# Patient Record
Sex: Male | Born: 1935 | Race: White | Hispanic: No | Marital: Married | Smoking: Former smoker
Health system: Southern US, Community
[De-identification: ages and names within clinical notes are randomized; demographics above are authoritative.]

## PROBLEM LIST (undated history)

## (undated) DIAGNOSIS — I10 Essential (primary) hypertension: Secondary | ICD-10-CM

## (undated) DIAGNOSIS — C801 Malignant (primary) neoplasm, unspecified: Secondary | ICD-10-CM

## (undated) HISTORY — PX: HIP FRACTURE SURGERY: SHX118

## (undated) HISTORY — PX: APPENDECTOMY: SHX54

## (undated) HISTORY — PX: FEMUR SURGERY: SHX943

## (undated) HISTORY — PX: FRACTURE SURGERY: SHX138

---

## 2006-04-01 ENCOUNTER — Ambulatory Visit: Payer: Self-pay | Admitting: Unknown Physician Specialty

## 2017-07-12 ENCOUNTER — Inpatient Hospital Stay
Admission: EM | Admit: 2017-07-12 | Discharge: 2017-07-15 | DRG: 078 | Disposition: A | Discharge status: Skilled Nursing Facility | Payer: Medicare Other | Attending: Internal Medicine | Admitting: Internal Medicine

## 2017-07-12 ENCOUNTER — Other Ambulatory Visit: Payer: Self-pay

## 2017-07-12 ENCOUNTER — Emergency Department: Payer: Medicare Other

## 2017-07-12 DIAGNOSIS — F0390 Unspecified dementia without behavioral disturbance: Secondary | ICD-10-CM | POA: Diagnosis present

## 2017-07-12 DIAGNOSIS — I161 Hypertensive emergency: Secondary | ICD-10-CM | POA: Diagnosis present

## 2017-07-12 DIAGNOSIS — Z8546 Personal history of malignant neoplasm of prostate: Secondary | ICD-10-CM | POA: Diagnosis not present

## 2017-07-12 DIAGNOSIS — Z96641 Presence of right artificial hip joint: Secondary | ICD-10-CM | POA: Diagnosis present

## 2017-07-12 DIAGNOSIS — Z9114 Patient's other noncompliance with medication regimen: Secondary | ICD-10-CM

## 2017-07-12 DIAGNOSIS — L89301 Pressure ulcer of unspecified buttock, stage 1: Secondary | ICD-10-CM | POA: Diagnosis present

## 2017-07-12 DIAGNOSIS — Z1389 Encounter for screening for other disorder: Secondary | ICD-10-CM

## 2017-07-12 DIAGNOSIS — Z8249 Family history of ischemic heart disease and other diseases of the circulatory system: Secondary | ICD-10-CM | POA: Diagnosis not present

## 2017-07-12 DIAGNOSIS — J209 Acute bronchitis, unspecified: Secondary | ICD-10-CM | POA: Diagnosis present

## 2017-07-12 DIAGNOSIS — T465X6A Underdosing of other antihypertensive drugs, initial encounter: Secondary | ICD-10-CM | POA: Diagnosis present

## 2017-07-12 DIAGNOSIS — I10 Essential (primary) hypertension: Secondary | ICD-10-CM | POA: Diagnosis present

## 2017-07-12 DIAGNOSIS — G9340 Encephalopathy, unspecified: Secondary | ICD-10-CM | POA: Diagnosis present

## 2017-07-12 DIAGNOSIS — Z7982 Long term (current) use of aspirin: Secondary | ICD-10-CM | POA: Diagnosis not present

## 2017-07-12 DIAGNOSIS — R4182 Altered mental status, unspecified: Secondary | ICD-10-CM

## 2017-07-12 DIAGNOSIS — E785 Hyperlipidemia, unspecified: Secondary | ICD-10-CM | POA: Diagnosis present

## 2017-07-12 DIAGNOSIS — E860 Dehydration: Secondary | ICD-10-CM

## 2017-07-12 DIAGNOSIS — Z87891 Personal history of nicotine dependence: Secondary | ICD-10-CM

## 2017-07-12 DIAGNOSIS — Z0189 Encounter for other specified special examinations: Secondary | ICD-10-CM

## 2017-07-12 DIAGNOSIS — L89891 Pressure ulcer of other site, stage 1: Secondary | ICD-10-CM | POA: Diagnosis present

## 2017-07-12 DIAGNOSIS — L8990 Pressure ulcer of unspecified site, unspecified stage: Secondary | ICD-10-CM

## 2017-07-12 DIAGNOSIS — F03A0 Unspecified dementia, mild, without behavioral disturbance, psychotic disturbance, mood disturbance, and anxiety: Secondary | ICD-10-CM

## 2017-07-12 DIAGNOSIS — I674 Hypertensive encephalopathy: Principal | ICD-10-CM | POA: Diagnosis present

## 2017-07-12 HISTORY — DX: Malignant (primary) neoplasm, unspecified: C80.1

## 2017-07-12 HISTORY — DX: Essential (primary) hypertension: I10

## 2017-07-12 LAB — CBC WITH DIFFERENTIAL/PLATELET
BASOS ABS: 0.1 10*3/uL (ref 0–0.1)
Basophils Relative: 1 %
EOS ABS: 0.3 10*3/uL (ref 0–0.7)
Eosinophils Relative: 4 %
HCT: 41.6 % (ref 40.0–52.0)
HEMOGLOBIN: 13.9 g/dL (ref 13.0–18.0)
LYMPHS PCT: 10 %
Lymphs Abs: 0.7 10*3/uL — ABNORMAL LOW (ref 1.0–3.6)
MCH: 30.8 pg (ref 26.0–34.0)
MCHC: 33.4 g/dL (ref 32.0–36.0)
MCV: 92 fL (ref 80.0–100.0)
Monocytes Absolute: 0.7 10*3/uL (ref 0.2–1.0)
Monocytes Relative: 10 %
NEUTROS PCT: 75 %
Neutro Abs: 4.9 10*3/uL (ref 1.4–6.5)
PLATELETS: 255 10*3/uL (ref 150–440)
RBC: 4.52 MIL/uL (ref 4.40–5.90)
RDW: 15 % — ABNORMAL HIGH (ref 11.5–14.5)
WBC: 6.5 10*3/uL (ref 3.8–10.6)

## 2017-07-12 LAB — COMPREHENSIVE METABOLIC PANEL
ALBUMIN: 4.1 g/dL (ref 3.5–5.0)
ALT: 15 U/L — AB (ref 17–63)
AST: 37 U/L (ref 15–41)
Alkaline Phosphatase: 57 U/L (ref 38–126)
Anion gap: 10 (ref 5–15)
BUN: 33 mg/dL — AB (ref 6–20)
CHLORIDE: 108 mmol/L (ref 101–111)
CO2: 21 mmol/L — AB (ref 22–32)
Calcium: 9.1 mg/dL (ref 8.9–10.3)
Creatinine, Ser: 1.03 mg/dL (ref 0.61–1.24)
GFR calc Af Amer: >60 mL/min (ref >60)
GFR calc non Af Amer: >60 mL/min (ref >60)
GLUCOSE: 107 mg/dL — AB (ref 65–99)
POTASSIUM: 3.8 mmol/L (ref 3.5–5.1)
Sodium: 139 mmol/L (ref 135–145)
Total Bilirubin: 0.7 mg/dL (ref 0.3–1.2)
Total Protein: 7.1 g/dL (ref 6.5–8.1)

## 2017-07-12 LAB — TROPONIN I: Troponin I: <0.03 ng/mL (ref <0.03)

## 2017-07-12 LAB — URINALYSIS, COMPLETE (UACMP) WITH MICROSCOPIC
BACTERIA UA: NONE SEEN
BILIRUBIN URINE: NEGATIVE
Glucose, UA: NEGATIVE mg/dL
Ketones, ur: 20 mg/dL — AB
LEUKOCYTES UA: NEGATIVE
Nitrite: NEGATIVE
PROTEIN: NEGATIVE mg/dL
SPECIFIC GRAVITY, URINE: 1.024 (ref 1.005–1.030)
pH: 5 (ref 5.0–8.0)

## 2017-07-12 LAB — ETHANOL

## 2017-07-12 MED ORDER — SODIUM CHLORIDE 0.9 % IV BOLUS
500.0000 mL | Freq: Once | INTRAVENOUS | Status: AC
  Administered 2017-07-12: 500 mL via INTRAVENOUS

## 2017-07-12 MED ORDER — LABETALOL HCL 5 MG/ML IV SOLN
20.0000 mg | Freq: Once | INTRAVENOUS | Status: AC
  Administered 2017-07-12: 20 mg via INTRAVENOUS
  Filled 2017-07-12: qty 4

## 2017-07-12 NOTE — H&P (Signed)
[facility] [place] NAME: Matthew Andersen    MR#:  [id number]  DATE OF BIRTH:  08-May-1931  DATE OF ADMISSION:  07/12/2017  PRIMARY CARE PHYSICIAN: Katheren Shams   REQUESTING/REFERRING PHYSICIAN:   CHIEF COMPLAINT:   Chief Complaint  Patient presents with  . Altered Mental Status    HISTORY OF PRESENT ILLNESS: Matthew Andersen  is a 84 y.o. male with a known history of hypertension. Patient is currently confused and cannot provide reliable history.  Most of the information was taken from reviewing the medical records and from discussion with emergency room physician. Apparently, he lives alone at home and he is usually independent and mentally very sharp.  He is still driving and was visiting his wife at the nursing home, earlier today, when he was noted to be confused.  He did not know the code to the nursing home door, which he normally does know.  He does not remember today's date and his date of birth, which again is very unusual per his son.  Patient himself, does not think there is anything wrong with him.  No fever or chills, no chest pain or shortness of breath, no bleeding noted.  She denies any fall.  He denies taking any new medications.  He denies drinking alcohol or taking illicit drugs. At the arrival to emergency room, his blood pressure was elevated at 197/104.  Patient does admit he has not been taking his medications "for a while". Blood test done emergency room, including CBC and CMP, are grossly unremarkable.  UA is negative for UTI.  Alcohol level is normal and urine drug screen test is negative. Chest x-ray and brain CT scan, read by myself are negative for acute abnormalities. Patient is admitted for further evaluation and treatment.  PAST MEDICAL HISTORY:   Past Medical History:  Diagnosis Date  . Cancer [facility])    Prostate  . Hypertension     PAST SURGICAL HISTORY:  Past Surgical History:  Procedure  Laterality Date  . APPENDECTOMY    . FEMUR SURGERY    . FRACTURE SURGERY    . HIP FRACTURE SURGERY      SOCIAL HISTORY:  Social History   Tobacco Use  . Smoking status: Former Research scientist (life sciences)  . Smokeless tobacco: Never Used  Substance Use Topics  . Alcohol use: Not Currently    FAMILY HISTORY: Hypertension in mother.   DRUG ALLERGIES:  Allergies  Allergen Reactions  . Cefzil [Cefprozil]   . Diphenhydramine   . Pseudoephedrine     REVIEW OF SYSTEMS:   Unable to obtain due to patient being confused.   MEDICATIONS AT HOME:  Prior to Admission medications   Medication Sig Start Date End Date Taking? Authorizing Provider  amLODipine-benazepril (LOTREL) 5-20 MG capsule Take 1 capsule by mouth daily.   Yes [provider]  aspirin EC 81 MG tablet Take 81 mg by mouth daily.   Yes [provider]  budesonide (PULMICORT) 180 MCG/ACT inhaler Inhale 1 puff into the lungs 2 (two) times daily.   Yes [provider]  pravastatin (PRAVACHOL) 10 MG tablet TAKE 1 TABLET BY MOUTH EVERYDAY AT BEDTIME 05/18/17  Yes [provider]      PHYSICAL EXAMINATION:   VITAL SIGNS: Blood pressure (!) 153/67, pulse 74, temperature 98.1 F (36.7 C), temperature source Oral, resp. rate 18, height 6' (1.829 m), weight 81.6 kg (180 lb), SpO2 96 %.  GENERAL:  84 y.o.-year-old patient lying  in the bed with no acute distress.  Pleasantly confused. EYES: Pupils equal, round, reactive to light and accommodation. No scleral icterus. Extraocular muscles intact.  HEENT: Head atraumatic, normocephalic. Oropharynx and nasopharynx clear.  NECK:  Supple, no jugular venous distention. No thyroid enlargement, no tenderness.  LUNGS: Normal breath sounds bilaterally, no wheezing, rales,rhonchi or crepitation. No use of accessory muscles of respiration.  CARDIOVASCULAR: S1, S2 normal. No S3/S4.  ABDOMEN: Soft, nontender, nondistended. Bowel sounds present. No organomegaly or mass.   EXTREMITIES: No pedal edema, cyanosis, or clubbing.  NEUROLOGIC: Cranial nerves II through XII are intact. Muscle strength 5/5 in all extremities. Sensation intact. Gait is stable.  PSYCHIATRIC: The patient is alert, but confused.  SKIN: No obvious rash or lesions.   LABORATORY PANEL:   CBC Recent Labs  Lab 07/12/17 2131  WBC 6.5  HGB 13.9  HCT 41.6  PLT 255  MCV 92.0  MCH 30.8  MCHC 33.4  RDW 15.0*  LYMPHSABS 0.7*  MONOABS 0.7  EOSABS 0.3  BASOSABS 0.1   ------------------------------------------------------------------------------------------------------------------  Chemistries  Recent Labs  Lab 07/12/17 2131  NA 139  K 3.8  CL 108  CO2 21*  GLUCOSE 107*  BUN 33*  CREATININE 1.03  CALCIUM 9.1  AST 37  ALT 15*  ALKPHOS 57  BILITOT 0.7   ------------------------------------------------------------------------------------------------------------------ estimated creatinine clearance is 59.6 mL/min (by C-G formula based on SCr of 1.03 mg/dL). ------------------------------------------------------------------------------------------------------------------ No results for input(s): TSH, T4TOTAL, T3FREE, THYROIDAB in the last 72 hours.  Invalid input(s): FREET3   Coagulation profile No results for input(s): INR, PROTIME in the last 168 hours. ------------------------------------------------------------------------------------------------------------------- No results for input(s): DDIMER in the last 72 hours. -------------------------------------------------------------------------------------------------------------------  Cardiac Enzymes Recent Labs  Lab 07/12/17 2131  TROPONINI <0.03   ------------------------------------------------------------------------------------------------------------------ Invalid input(s): POCBNP  ---------------------------------------------------------------------------------------------------------------  Urinalysis     Component Value Date/Time   COLORURINE YELLOW (A) 07/12/2017 2131   APPEARANCEUR CLEAR (A) 07/12/2017 2131   LABSPEC 1.024 07/12/2017 2131   PHURINE 5.0 07/12/2017 2131   GLUCOSEU NEGATIVE 07/12/2017 2131   HGBUR SMALL (A) 07/12/2017 2131   BILIRUBINUR NEGATIVE 07/12/2017 2131   KETONESUR 20 (A) 07/12/2017 2131   PROTEINUR NEGATIVE 07/12/2017 2131   NITRITE NEGATIVE 07/12/2017 2131   LEUKOCYTESUR NEGATIVE 07/12/2017 2131     RADIOLOGY: Dg Chest 2 View  Result Date: 07/12/2017 CLINICAL DATA:  Cough for a week.  Possible altered mental status. EXAM: CHEST - 2 VIEW COMPARISON:  None. FINDINGS: Mild hyperinflation. Normal heart size and pulmonary vascularity. No focal airspace disease or consolidation in the lungs. No blunting of costophrenic angles. No pneumothorax. Mediastinal contours appear intact. Calcification of the aorta. Degenerative changes in the spine and shoulders. IMPRESSION: Mild hyperinflation. No evidence of active pulmonary disease. Aortic atherosclerosis. Electronically Signed   By: Lucienne Capers M.D.   On: 07/12/2017 21:39   Ct Head Wo Contrast  Result Date: 07/12/2017 CLINICAL DATA:  Per EMS, pt was visiting his wife at [facility] where staff noticed pt was acting confused. EMS states pt's wife has been a resident there for 4 years however pt states she has been there 4 days, staff reports he was not able to locate his car. EMS states pt had difficulty answering year and DOB. Pt is able to answer DOB here at this time. EMS reports stroke screen negative. EXAM: CT HEAD WITHOUT CONTRAST TECHNIQUE: Contiguous axial images were obtained from the base of the skull through the vertex without intravenous contrast. COMPARISON:  None. FINDINGS: Brain: No  evidence of acute infarction, hemorrhage, hydrocephalus, extra-axial collection or mass lesion/mass effect. There is ventricular and sulcal enlargement reflecting age-appropriate volume loss. Mild patchy areas of white matter  hypoattenuation noted consistent with chronic microvascular ischemic change. Vascular: No hyperdense vessel or unexpected calcification. Skull: Normal. Negative for fracture or focal lesion. Sinuses/Orbits: Globes and orbits are unremarkable. Mild ethmoid sinus mucosal thickening. Remaining visualized sinuses and mastoid air cells are clear. Other: None. IMPRESSION: 1. No acute intracranial abnormalities. 2. Age related volume loss. Mild chronic microvascular ischemic change. Electronically Signed   By: Lajean Manes M.D.   On: 07/12/2017 21:28    EKG: Orders placed or performed during the hospital encounter of 07/12/17  . ED EKG  . ED EKG  . EKG 12-Lead  . EKG 12-Lead    IMPRESSION AND PLAN:  1.  Acute encephalopathy, likely hypertensive encephalopathy, due to noncompliance of blood pressure medications. 2.  Hypertensive emergency.  Plan:  Will avoid sudden, massive reduction in the blood pressure.  For now we will restart his home blood pressure medications and continue to monitor BP closely.  Continue frequent neuro checks.  Will rule out TIA/stroke.  Will check 2D echo, carotid ultrasound and brain MRI.  Neurology is consulted for further evaluation and treatment.  All the records are reviewed and case discussed with ED provider. Management plans discussed with the patient, family and they are in agreement.  CODE STATUS: FULL Advance Directive Documentation     Most Recent Value  Type of Advance Directive  Healthcare Power of Attorney, Living will  Pre-existing out of facility DNR order (yellow form or pink MOST form)  -  "MOST" Form in Place?  -       TOTAL TIME TAKING CARE OF THIS PATIENT: 45 minutes.    Amelia Jo M.D on 07/12/2017 at 11:41 PM  Between 7am to 6pm - Pager - [phone]  After 6pm go to [email or web address] - password [email or web address] [facility]  [facility]  Office  [phone]  CC: Primary care physician; Katheren Shams

## 2017-07-12 NOTE — ED Notes (Signed)
Pt states he is unsure if he has been taking his BP medications as ordered.

## 2017-07-12 NOTE — ED Provider Notes (Signed)
[facility] Emergency Department Provider Note  ____________________________________________   I have reviewed the triage vital signs and the nursing notes. Where available I have reviewed prior notes and, if possible and indicated, outside hospital notes.    HISTORY  Chief Complaint Altered Mental Status    HPI Matthew Andersen is a 84 y.o. male lives at home since his wife was placed in a nursing facility several years ago.  He is a retired Licensed conveyancer.  According to his son Shanon Brow, he is never unsure of the date and mentally very sharp.  He called his son a few times this morning and could not remember why he called.  This is atypical for him.  He went to the nursing home today it was noted that he did not know the code to the nursing home although he normally knows it, and was unsure of how long his wife had been there.  He also seem to be having trouble getting into his car, he went to the trunk instead of the car itself.  Etc.  Patient himself denies any confusion he states he feels his normal self.  He has no complaint.  He does have a chronic mild cough he states.  Patient has not been to this facility before.  He denies any fevers chills chest pain shortness of breath headache focal numbness or weakness, or any other complaints.  Patient states that his wife is been at the facility for 4 days and not for years which has been verified ability falls, according to EMS, and patient was irascible with them when he did not know the answers to some of the questions.  He did not know his birthday for that which is atypical apparently. Is no way to tell when he began to become confused.  Patient states he has not been taking his blood pressure medication for the last month.   No past medical history on file.  There are no active problems to display for this patient.     Prior to Admission medications   Not on File    Allergies Patient has no allergy  information on record.  No family history on file.  Social History Social History   Tobacco Use  . Smoking status: Not on file  Substance Use Topics  . Alcohol use: Not on file  . Drug use: Not on file    Review of Systems Constitutional: No fever/chills Eyes: No visual changes. ENT: No sore throat. No stiff neck no neck pain Cardiovascular: Denies chest pain. Respiratory: Denies shortness of breath. Gastrointestinal:   no vomiting.  No diarrhea.  No constipation. Genitourinary: Negative for dysuria. Musculoskeletal: Negative lower extremity swelling Skin: Negative for rash. Neurological: Negative for severe headaches, focal weakness or numbness.   ____________________________________________   PHYSICAL EXAM:  VITAL SIGNS: ED Triage Vitals  Enc Vitals Group     BP --      Pulse --      Resp --      Temp --      Temp src --      SpO2 --      Weight 07/12/17 2101 180 lb (81.6 kg)     Height 07/12/17 2101 6' (1.829 m)     Head Circumference --      Peak Flow --      Pain Score 07/12/17 2058 0     Pain Loc --      Pain Edu? --  Excl. in [place]? --     Constitutional: Alert and name unsure of the day and thinks it might be 2010 initially was unsure of his birthday although now can tell me, patient seems to have some lack of knowledge about things that apparently he should know. Well appearing and in no acute distress. Eyes: Conjunctivae are normal Head: Atraumatic HEENT: No congestion/rhinnorhea. Mucous membranes are moist.  Oropharynx non-erythematous Neck:   Nontender with no meningismus, no masses, no stridor Cardiovascular: Normal rate, regular rhythm. Grossly normal heart sounds.  Good peripheral circulation. Respiratory: Normal respiratory effort.  No retractions. Lungs CTAB. Abdominal: Soft and nontender. No distention. No guarding no rebound Back:  There is no focal tenderness or step off.  there is no midline tenderness there are no lesions noted. there  is no CVA tenderness Musculoskeletal: No lower extremity tenderness, no upper extremity tenderness. No joint effusions, no DVT signs strong distal pulses no edema Neurologic:  Normal speech and language. No gross focal neurologic deficits are appreciated.  Skin:  Skin is warm, dry and intact. No rash noted. Psychiatric: Mood and affect are normal. Speech and behavior are normal.  ____________________________________________   LABS (all labs ordered are listed, but only abnormal results are displayed)  Labs Reviewed  URINALYSIS, COMPLETE (UACMP) WITH MICROSCOPIC  CBC WITH DIFFERENTIAL/PLATELET  ETHANOL  COMPREHENSIVE METABOLIC PANEL    Pertinent labs  results that were available during my care of the patient were reviewed by me and considered in my medical decision making (see chart for details). ____________________________________________  EKG  I personally interpreted any EKGs ordered by me or triage No old for comparison, sinus rhythm rate 94, LAFB noted, RBBB noted.  No acute ischemic changes ____________________________________________  RADIOLOGY  Pertinent labs & imaging results that were available during my care of the patient were reviewed by me and considered in my medical decision making (see chart for details). If possible, patient and/or family made aware of any abnormal findings.  No results found. ____________________________________________    PROCEDURES  Procedure(s) performed: None  Procedures  Critical Care performed: None  ____________________________________________   INITIAL IMPRESSION / ASSESSMENT AND PLAN / ED COURSE  Pertinent labs & imaging results that were available during my care of the patient were reviewed by me and considered in my medical decision making (see chart for details).  Patient here with acute atypical confusion, apparently acute according to family, we will admit him to the hospital for further work-up.  His blood pressure  is elevated, hypertension certainly could cause this or an occult ischemic event, CT is reassuring.  I did give him medication his blood pressure is trending down.  We will admit for further observation.    ____________________________________________   FINAL CLINICAL IMPRESSION(S) / ED DIAGNOSES  Final diagnoses:  None      This chart was dictated using voice recognition software.  Despite best efforts to proofread,  errors can occur which can change meaning.      Schuyler Amor, MD 07/12/17 [phone]

## 2017-07-12 NOTE — ED Notes (Signed)
This RN spoke with pt's son Shanon Brow per pt's consent. Shanon Brow states his father has not had hx of confusion or dementia/alzheimer's. Shanon Brow states his father called him 3 times this morning around 9am, when Shanon Brow returned the calls around 3pm his father was not sure if he had called then and stated "I guess I did, I don't know." Shanon Brow states WOM contacted him about pt being here.  David's # for updates: [phone]

## 2017-07-12 NOTE — ED Notes (Signed)
Patient transported to CT 

## 2017-07-12 NOTE — ED Triage Notes (Signed)
Per EMS, pt was visiting his wife at [facility] where staff noticed pt was acting confused. EMS states pt's wife has been a resident there for 4 years however pt states she has been there 4 days, staff reports he was not able to locate his car. EMS states pt had difficulty answering year and DOB. Pt is able to answer DOB here at this time. EMS reports stroke screen negative. EMS VS: 101 blood sugar, 197/104, 110-115 HR, 97% on RA.

## 2017-07-13 ENCOUNTER — Inpatient Hospital Stay: Payer: Medicare Other

## 2017-07-13 ENCOUNTER — Encounter: Payer: Self-pay | Admitting: Radiology

## 2017-07-13 ENCOUNTER — Inpatient Hospital Stay
Admit: 2017-07-13 | Discharge: 2017-07-13 | Disposition: A | Discharge status: Home / Self Care | Payer: Medicare Other | Attending: Internal Medicine | Admitting: Internal Medicine

## 2017-07-13 DIAGNOSIS — L8990 Pressure ulcer of unspecified site, unspecified stage: Secondary | ICD-10-CM

## 2017-07-13 LAB — URINE DRUG SCREEN, QUALITATIVE (ARMC ONLY)
AMPHETAMINES, UR SCREEN: NOT DETECTED
BENZODIAZEPINE, UR SCRN: NOT DETECTED
Cannabinoid 50 Ng, Ur ~~LOC~~: NOT DETECTED
Cocaine Metabolite,Ur ~~LOC~~: NOT DETECTED
MDMA (Ecstasy)Ur Screen: NOT DETECTED
METHADONE SCREEN, URINE: NOT DETECTED
Opiate, Ur Screen: NOT DETECTED
PHENCYCLIDINE (PCP) UR S: NOT DETECTED
Tricyclic, Ur Screen: NOT DETECTED

## 2017-07-13 LAB — HEMOGLOBIN A1C
Hgb A1c MFr Bld: 5 % (ref 4.8–5.6)
Mean Plasma Glucose: 96.8 mg/dL

## 2017-07-13 LAB — ECHOCARDIOGRAM COMPLETE
HEIGHTINCHES: 72 in
Weight: 2880 oz

## 2017-07-13 LAB — LIPID PANEL
Cholesterol: 187 mg/dL (ref 0–200)
HDL: 82 mg/dL (ref >40)
LDL CALC: 98 mg/dL (ref 0–99)
Total CHOL/HDL Ratio: 2.3 RATIO
Triglycerides: 33 mg/dL (ref <150)
VLDL: 7 mg/dL (ref 0–40)

## 2017-07-13 LAB — TSH: TSH: 1.415 u[IU]/mL (ref 0.350–4.500)

## 2017-07-13 MED ORDER — SODIUM CHLORIDE 0.9% FLUSH
3.0000 mL | INTRAVENOUS | Status: DC | PRN
  Administered 2017-07-13: 13:00:00 3 mL via INTRAVENOUS
  Filled 2017-07-13: qty 3

## 2017-07-13 MED ORDER — ACETAMINOPHEN 160 MG/5ML PO SOLN
650.0000 mg | ORAL | Status: DC | PRN
  Filled 2017-07-13: qty 20.3

## 2017-07-13 MED ORDER — AMLODIPINE BESY-BENAZEPRIL HCL 5-20 MG PO CAPS: 1.0000 | ORAL_CAPSULE | Freq: Every day | ORAL | Status: DC

## 2017-07-13 MED ORDER — STROKE: EARLY STAGES OF RECOVERY BOOK
Freq: Once | Status: AC
  Administered 2017-07-13: 02:00:00

## 2017-07-13 MED ORDER — BENAZEPRIL HCL 20 MG PO TABS
20.0000 mg | ORAL_TABLET | Freq: Every day | ORAL | Status: DC
  Administered 2017-07-13 – 2017-07-15 (×3): 20 mg via ORAL
  Filled 2017-07-13 (×4): qty 1

## 2017-07-13 MED ORDER — GUAIFENESIN-DM 100-10 MG/5ML PO SYRP
5.0000 mL | ORAL_SOLUTION | ORAL | Status: DC | PRN
  Administered 2017-07-13: 5 mL via ORAL
  Filled 2017-07-13 (×2): qty 5

## 2017-07-13 MED ORDER — BUDESONIDE 0.25 MG/2ML IN SUSP
0.2500 mg | Freq: Two times a day (BID) | RESPIRATORY_TRACT | Status: DC
  Administered 2017-07-13 – 2017-07-15 (×6): 0.25 mg via RESPIRATORY_TRACT
  Filled 2017-07-13 (×6): qty 2

## 2017-07-13 MED ORDER — ACETAMINOPHEN 325 MG PO TABS: 650.0000 mg | ORAL_TABLET | ORAL | Status: DC | PRN

## 2017-07-13 MED ORDER — SODIUM CHLORIDE 0.9 % IV SOLN
Freq: Once | INTRAVENOUS | Status: AC
  Administered 2017-07-13: 02:00:00 via INTRAVENOUS

## 2017-07-13 MED ORDER — HYDRALAZINE HCL 20 MG/ML IJ SOLN: 5.0000 mg | INTRAMUSCULAR | Status: DC | PRN

## 2017-07-13 MED ORDER — IOHEXOL 350 MG/ML SOLN
75.0000 mL | Freq: Once | INTRAVENOUS | Status: AC | PRN
  Administered 2017-07-13: 75 mL via INTRAVENOUS

## 2017-07-13 MED ORDER — ASPIRIN EC 81 MG PO TBEC
81.0000 mg | DELAYED_RELEASE_TABLET | Freq: Every day | ORAL | Status: DC
  Administered 2017-07-13 – 2017-07-15 (×3): 81 mg via ORAL
  Filled 2017-07-13 (×3): qty 1

## 2017-07-13 MED ORDER — SENNOSIDES-DOCUSATE SODIUM 8.6-50 MG PO TABS: 1.0000 | ORAL_TABLET | Freq: Every evening | ORAL | Status: DC | PRN

## 2017-07-13 MED ORDER — PRAVASTATIN SODIUM 20 MG PO TABS
20.0000 mg | ORAL_TABLET | Freq: Every day | ORAL | Status: DC
  Administered 2017-07-13 – 2017-07-14 (×2): 20 mg via ORAL
  Filled 2017-07-13 (×2): qty 1

## 2017-07-13 MED ORDER — AMLODIPINE BESYLATE 5 MG PO TABS
5.0000 mg | ORAL_TABLET | Freq: Every day | ORAL | Status: DC
  Administered 2017-07-13 – 2017-07-15 (×3): 5 mg via ORAL
  Filled 2017-07-13 (×3): qty 1

## 2017-07-13 MED ORDER — ACETAMINOPHEN 650 MG RE SUPP: 650.0000 mg | RECTAL | Status: DC | PRN

## 2017-07-13 MED ORDER — QUETIAPINE FUMARATE 25 MG PO TABS
25.0000 mg | ORAL_TABLET | Freq: Once | ORAL | Status: AC
  Administered 2017-07-13: 25 mg via ORAL
  Filled 2017-07-13: qty 1

## 2017-07-13 MED ORDER — HEPARIN SODIUM (PORCINE) 5000 UNIT/ML IJ SOLN
5000.0000 [IU] | Freq: Three times a day (TID) | INTRAMUSCULAR | Status: DC
  Administered 2017-07-13 – 2017-07-15 (×7): 5000 [IU] via SUBCUTANEOUS
  Filled 2017-07-13 (×7): qty 1

## 2017-07-13 MED ORDER — SODIUM CHLORIDE 0.9% FLUSH
3.0000 mL | Freq: Two times a day (BID) | INTRAVENOUS | Status: DC
  Administered 2017-07-13 – 2017-07-15 (×5): 3 mL via INTRAVENOUS

## 2017-07-13 MED ORDER — BUDESONIDE 180 MCG/ACT IN AEPB: 1.0000 | INHALATION_SPRAY | Freq: Two times a day (BID) | RESPIRATORY_TRACT | Status: DC

## 2017-07-13 NOTE — ED Notes (Signed)
Transport to floor room 118.AS

## 2017-07-13 NOTE — Progress Notes (Signed)
OT Cancellation Note  Patient Details Name: AUTREY HUMAN MRN: [id number] DOB: [birth year]   Cancelled Treatment:    Reason Eval/Treat Not Completed: Patient at procedure or test/ unavailable. Order received, chart reviewed. Pt out of room for testing. Will re-attempt OT evaluation at later date/time as pt is available and medically appropriate.  Jeni Salles, MPH, MS, OTR/L ascom [phone] 07/13/17, 9:05 AM

## 2017-07-13 NOTE — Evaluation (Signed)
Physical Therapy Evaluation Patient Details Name: Matthew Andersen MRN: [id number] DOB: 10-27-1931 Today's Date: 07/13/2017   History of Present Illness  presented to ER secondary to AMS; admitted for management of acute encephalopathy, likely hypertensive per chart.  CTH negative for acute intracranial process; MRI pending.  Clinical Impression  Upon evaluation, patient alert and oriented to self, location; follows simple commands, but demonstrates noted deficits in STM, safety awareness/insight and problem-solving. Unable to demonstrate ability to indep manage household responsibilities at this time (due to acute cognitive deficits).  Bilat UE/LE strength and ROM grossly symmetrical and WFL; no focal weakness, coordination or sensory deficit appreciated.  Able to complete bed mobility with mod indep; sit/stand, basic transfers and gait (25') without assist device, min assist.  Very short, shuffling and slightly staggered steps, requiring external support from environment and therapist to stabilize.  Additional trial (160') completed with RW, performance improving to cga/close sup with noted improvement in comfort, confidence.  Do recommend continued use of RW with all mobility at this time; patient voices agreement/understanding. Would benefit from skilled PT to address above deficits and promote optimal return to PLOF; recommend transition to STR upon discharge from acute hospitalization.  Will continue to monitor and update recommendations as medical status, cognitive status improves throughout hospitalization.     Follow Up Recommendations SNF    Equipment Recommendations  Rolling walker with 5" wheels    Recommendations for Other Services       Precautions / Restrictions Precautions Precautions: Fall Restrictions Weight Bearing Restrictions: No      Mobility  Bed Mobility Overal bed mobility: Modified Independent                Transfers Overall transfer level: Needs  assistance   Transfers: Sit to/from Stand Sit to Stand: Min guard;Min assist            Ambulation/Gait Ambulation/Gait assistance: Min assist Gait Distance (Feet): 20 Feet Assistive device: None       General Gait Details: very short, shuffling, staggering steps; decreased weight acceptance L LE (due to L knee pain).  Guarded trunk position with limited arm swing; intermittently reaching for walls/furniture for external stabilization  Stairs            Wheelchair Mobility    Modified Rankin (Stroke Patients Only)       Balance Overall balance assessment: Needs assistance Sitting-balance support: No upper extremity supported;Feet supported Sitting balance-Leahy Scale: Good     Standing balance support: No upper extremity supported Standing balance-Leahy Scale: Fair                               Pertinent Vitals/Pain Pain Assessment: No/denies pain    Home Living Family/patient expects to be discharged to:: Private residence Living Arrangements: Alone   Type of Home: House Home Access: Stairs to enter Entrance Stairs-Rails: Psychiatric nurse of Steps: 5 Home Layout: One level Home Equipment: Environmental consultant - 2 wheels;Cane - single point      Prior Function Level of Independence: Independent         Comments: Indep with ADLs, household and community mobilization without assist device; + driving; visits wife daily in STR/LTC     Hand Dominance        Extremity/Trunk Assessment   Upper Extremity Assessment Upper Extremity Assessment: Overall WFL for tasks assessed    Lower Extremity Assessment Lower Extremity Assessment: Overall WFL for tasks assessed(grossly at least  4/5 throughout; moderate valgus to L knee)       Communication   Communication: No difficulties  Cognition Arousal/Alertness: Awake/alert Behavior During Therapy: WFL for tasks assessed/performed Overall Cognitive Status: Impaired/Different from  baseline                                 General Comments: oriented to self, location; appropriately refers to watch for date/time (compensatory strategy).  Follows simple commands; noted difficulty with STM, processing, safety awareness and insight.  Unable to demonstrate ability to safely manage household responsibilities.      General Comments      Exercises Other Exercises Other Exercises: 28' with RW, cga/close sup-improved step height/length and overall gait symmetry; improved comfort and confidence with use of RW.  L knee with persistent valgus, maintains flexed position throughout gait cycle (reports this is baseline for him).  Do recommend continued use of RW with all mobility at this time; patient voices agreement/understanding.   Assessment/Plan    PT Assessment Patient needs continued PT services  PT Problem List Decreased strength;Decreased activity tolerance;Decreased balance;Decreased mobility;Decreased coordination;Decreased cognition;Decreased knowledge of use of DME;Decreased safety awareness;Decreased knowledge of precautions       PT Treatment Interventions DME instruction;Gait training;Stair training;Functional mobility training;Therapeutic activities;Therapeutic exercise;Balance training;Cognitive remediation;Patient/family education    PT Goals (Current goals can be found in the Care Plan section)  Acute Rehab PT Goals Patient Stated Goal: to move around a little PT Goal Formulation: With patient Time For Goal Achievement: 07/27/17 Potential to Achieve Goals: Good    Frequency Min 2X/week   Barriers to discharge Decreased caregiver support      Co-evaluation               AM-PAC PT "6 Clicks" Daily Activity  Outcome Measure Difficulty turning over in bed (including adjusting bedclothes, sheets and blankets)?: None Difficulty moving from lying on back to sitting on the side of the bed? : None Difficulty sitting down on and standing  up from a chair with arms (e.g., wheelchair, bedside commode, etc,.)?: None Help needed moving to and from a bed to chair (including a wheelchair)?: A Little Help needed walking in hospital room?: A Little Help needed climbing 3-5 steps with a railing? : A Little 6 Click Score: 21    End of Session Equipment Utilized During Treatment: Gait belt Activity Tolerance: Patient tolerated treatment well Patient left: in chair;with call bell/phone within reach;with chair alarm set Nurse Communication: Mobility status PT Visit Diagnosis: Unsteadiness on feet (R26.81);Difficulty in walking, not elsewhere classified (R26.2)    Time: 3532-9924 PT Time Calculation (min) (ACUTE ONLY): 33 min   Charges:   PT Evaluation $PT Eval Moderate Complexity: 1 Mod PT Treatments $Gait Training: 8-22 mins   PT G Codes:        Cleston Lautner H. Owens Shark, PT, DPT, NCS 07/13/17, 9:56 AM [phone]

## 2017-07-13 NOTE — Progress Notes (Signed)
Patient ID: Matthew Andersen, male   DOB: [birth year], 84 y.o.   MRN: [id number]  [employer] PROGRESS NOTE  VEGA WITHROW ZJQ:[id number] DOB: Feb 05, 1931 DOA: 07/12/2017 PCP: Katheren Shams  HPI/Subjective: Patient feels okay.  His major complaint that is gone to go home to feed the cat.  He states that he went to visit his wife at [facility] and could not find his car and clicked his clicker to open up the trunk and then he saw the car.  People from [facility] believes he was not acting right and they called the police and they brought him into the hospital with EMS.  His blood pressure was found to be very high and he was admitted to the hospital.  Objective: Vitals:   07/13/17 0730 07/13/17 1124  BP: (!) 170/82 (!) 161/81  Pulse: 83 78  Resp:  20  Temp:  97.6 F (36.4 C)  SpO2: 96% 97%    Filed Weights   07/12/17 2101  Weight: 81.6 kg (180 lb)    ROS: Review of Systems  Constitutional: Negative for chills and fever.  Eyes: Negative for blurred vision.  Respiratory: Negative for cough and shortness of breath.   Cardiovascular: Negative for chest pain.  Gastrointestinal: Negative for abdominal pain, constipation, diarrhea, nausea and vomiting.  Genitourinary: Negative for dysuria.  Musculoskeletal: Negative for joint pain.  Neurological: Negative for dizziness and headaches.   Exam: Physical Exam  HENT:  Nose: No mucosal edema.  Mouth/Throat: No oropharyngeal exudate or posterior oropharyngeal edema.  Eyes: Pupils are equal, round, and reactive to light. Conjunctivae, EOM and lids are normal.  Neck: No JVD present. Carotid bruit is not present. No edema present. No thyroid mass and no thyromegaly present.  Cardiovascular: S1 normal and S2 normal. Exam reveals no gallop.  No murmur heard. Pulses:      Dorsalis pedis pulses are 2+ on the right side, and 2+ on the left side.  Respiratory: No respiratory distress. He has no wheezes. He has no  rhonchi. He has no rales.  GI: Soft. Bowel sounds are normal. There is no tenderness.  Musculoskeletal:       Right ankle: He exhibits no swelling.       Left ankle: He exhibits no swelling.  Lymphadenopathy:    He has no cervical adenopathy.  Neurological: He is alert. No cranial nerve deficit.  Power 5 out of 5 bilateral upper and lower extremities.  Skin: Skin is warm. No rash noted. Nails show no clubbing.  Psychiatric: He has a normal mood and affect.      Data Reviewed: Basic Metabolic Panel: Recent Labs  Lab 07/12/17 2131  NA 139  K 3.8  CL 108  CO2 21*  GLUCOSE 107*  BUN 33*  CREATININE 1.03  CALCIUM 9.1   Liver Function Tests: Recent Labs  Lab 07/12/17 2131  AST 37  ALT 15*  ALKPHOS 57  BILITOT 0.7  PROT 7.1  ALBUMIN 4.1   CBC: Recent Labs  Lab 07/12/17 2131  WBC 6.5  NEUTROABS 4.9  HGB 13.9  HCT 41.6  MCV 92.0  PLT 255   Cardiac Enzymes: Recent Labs  Lab 07/12/17 2131  TROPONINI <0.03     Studies: Dg Chest 2 View  Result Date: 07/12/2017 CLINICAL DATA:  Cough for a week.  Possible altered mental status. EXAM: CHEST - 2 VIEW COMPARISON:  None. FINDINGS: Mild hyperinflation. Normal heart size and pulmonary vascularity. No focal airspace disease or consolidation  in the lungs. No blunting of costophrenic angles. No pneumothorax. Mediastinal contours appear intact. Calcification of the aorta. Degenerative changes in the spine and shoulders. IMPRESSION: Mild hyperinflation. No evidence of active pulmonary disease. Aortic atherosclerosis. Electronically Signed   By: Lucienne Capers M.D.   On: 07/12/2017 21:39   Dg Pelvis 1-2 Views  Result Date: 07/13/2017 CLINICAL DATA:  MRI clearance. EXAM: PELVIS - 1-2 VIEW COMPARISON:  None. FINDINGS: Exam demonstrates a right total hip arthroplasty intact and normally located. There are degenerative changes of the spine and left hip. IMPRESSION: No acute findings. Right total hip arthroplasty intact.  Electronically Signed   By: Marin Olp M.D.   On: 07/13/2017 09:49   Dg Abd 1 View  Result Date: 07/13/2017 CLINICAL DATA:  MRI clearance. EXAM: ABDOMEN - 1 VIEW COMPARISON:  None. FINDINGS: Examination demonstrates a nonobstructive bowel gas pattern with mild fecal retention throughout the colon. Moderate degenerate change of the spine. Mild degenerate change of the left hip. Right total hip arthroplasty is present. IMPRESSION: Nonobstructive bowel gas pattern with mild fecal retention throughout the colon. Partially visualized right hip arthroplasty intact. Electronically Signed   By: Marin Olp M.D.   On: 07/13/2017 09:49   Ct Head Wo Contrast  Result Date: 07/12/2017 CLINICAL DATA:  Per EMS, pt was visiting his wife at [facility] where staff noticed pt was acting confused. EMS states pt's wife has been a resident there for 4 years however pt states she has been there 4 days, staff reports he was not able to locate his car. EMS states pt had difficulty answering year and DOB. Pt is able to answer DOB here at this time. EMS reports stroke screen negative. EXAM: CT HEAD WITHOUT CONTRAST TECHNIQUE: Contiguous axial images were obtained from the base of the skull through the vertex without intravenous contrast. COMPARISON:  None. FINDINGS: Brain: No evidence of acute infarction, hemorrhage, hydrocephalus, extra-axial collection or mass lesion/mass effect. There is ventricular and sulcal enlargement reflecting age-appropriate volume loss. Mild patchy areas of white matter hypoattenuation noted consistent with chronic microvascular ischemic change. Vascular: No hyperdense vessel or unexpected calcification. Skull: Normal. Negative for fracture or focal lesion. Sinuses/Orbits: Globes and orbits are unremarkable. Mild ethmoid sinus mucosal thickening. Remaining visualized sinuses and mastoid air cells are clear. Other: None. IMPRESSION: 1. No acute intracranial abnormalities. 2. Age related volume  loss. Mild chronic microvascular ischemic change. Electronically Signed   By: Lajean Manes M.D.   On: 07/12/2017 21:28   Mr Brain Wo Contrast  Result Date: 07/13/2017 CLINICAL DATA:  Altered mental status. EXAM: MRI HEAD WITHOUT CONTRAST TECHNIQUE: Multiplanar, multiecho pulse sequences of the brain and surrounding structures were obtained without intravenous contrast. COMPARISON:  Head CT 07/12/2017 FINDINGS: Brain: There is no evidence of acute infarct, intracranial hemorrhage, mass, midline shift, or extra-axial fluid collection. Generalized cerebral atrophy is mild-to-moderate for age. Scattered cerebral white matter T2 hyperintensities are nonspecific but compatible with chronic small vessel ischemic disease, minimal for age. A chronic lacunar infarct is noted in the left thalamus. Vascular: Major intracranial vascular flow voids are preserved. Skull and upper cervical spine: No suspicious marrow lesion. Sinuses/Orbits: Bilateral cataract extraction. Mild bilateral ethmoid air cell mucosal thickening. Small right maxillary sinus mucous retention cyst. Clear mastoid air cells. Other: None. IMPRESSION: 1. No acute intracranial abnormality. 2. Mild-to-moderate cerebral atrophy and minimal chronic small vessel ischemic disease. Electronically Signed   By: Logan Bores M.D.   On: 07/13/2017 11:29   US Carotid Bilateral (  at [place] Only)  Result Date: 07/13/2017 CLINICAL DATA:  Acute encephalopathy. History of hypertension and smoking. EXAM: BILATERAL CAROTID DUPLEX ULTRASOUND TECHNIQUE: Pearline Cables scale imaging, color Doppler and duplex ultrasound were performed of bilateral carotid and vertebral arteries in the neck. COMPARISON:  None. FINDINGS: Criteria: Quantification of carotid stenosis is based on velocity parameters that correlate the residual internal carotid diameter with NASCET-based stenosis levels, using the diameter of the distal internal carotid lumen as the denominator for stenosis measurement.  The following velocity measurements were obtained: RIGHT ICA:  148/21 cm/sec CCA:  19/14 cm/sec SYSTOLIC ICA/CCA RATIO:  1.7 ECA:  153 cm/sec LEFT ICA:  96/17 cm/sec CCA:  782/95 cm/sec SYSTOLIC ICA/CCA RATIO:  0.9 ECA:  94 cm/sec RIGHT CAROTID ARTERY: There is a minimal amount of eccentric mixed echogenic plaque scattered throughout the right common carotid artery (images 3 and 7). There is a moderate amount of eccentric mixed echogenic plaque within the right carotid bulb (image 15). There is a large amount of eccentric echogenic densely shadowing plaque involving the origin and proximal aspects of the right internal carotid artery (image 23), resulting in elevated peak systolic velocities within the proximal aspect the right internal carotid artery. Greatest acquired peak systolic velocity with the proximal right ICA measures 140 cm/sec - image 24. RIGHT VERTEBRAL ARTERY:  Antegrade Flow LEFT CAROTID ARTERY: There is a minimal amount of eccentric mixed echogenic plaque involving the origin and proximal aspects of the left internal carotid artery (image 55), not resulting in elevated peak systolic velocities within the interrogated course the left internal carotid artery to suggest a hemodynamically significant stenosis. LEFT VERTEBRAL ARTERY: Antegrade flow though monophasic waveform is demonstrated. IMPRESSION: 1. Moderate to large amount of right-sided atherosclerotic plaque results in elevated peak systolic velocities within the right internal carotid artery compatible with the 50 to 69% luminal narrowing range. Further evaluation with CTA could be performed as clinically indicated. 2. Minimal amount of left-sided atherosclerotic plaque, not resulting in a hemodynamically significant stenosis. Electronically Signed   By: Sandi Mariscal M.D.   On: 07/13/2017 08:36    Scheduled Meds: . amLODipine  5 mg Oral Daily   And  . benazepril  20 mg Oral Daily  . aspirin EC  81 mg Oral Daily  . budesonide (PULMICORT)  nebulizer solution  0.25 mg Nebulization BID  . heparin  5,000 Units Subcutaneous Q8H  . pravastatin  20 mg Oral q1800  . sodium chloride flush  3 mL Intravenous Q12H   Continuous Infusions:  Assessment/Plan:  1. Hypertensive encephalopathy.  Patient noncompliant with his blood pressure medications.  Blood pressure trending better on Norvasc and benazepril.  Mental status is still a little concerning.  Send off TSH, RPR and vitamin B12.  MRI of the brain negative for stroke. 2. Carotid stenosis on sonogram.  Get a CT Angie of the neck for further evaluation. 3. Hyperlipidemia unspecified on pravastatin 4. History of prostate cancer. 5. Unsteady gait.  Physical therapy recommended rehab.  Spoke with son.  He has been having difficulty walking for long time. 6. Stage I decubitus ulcer on buttock, stage I ulceration left second toe  Code Status:     Code Status Orders  (From admission, onward)        Start     Ordered   07/13/17 0055  Full code  Continuous     07/13/17 0055    Code Status History    This patient has a current code status but no  historical code status.    Advance Directive Documentation     Most Recent Value  Type of Advance Directive  Healthcare Power of Attorney  Pre-existing out of facility DNR order (yellow form or pink MOST form)  -  "MOST" Form in Place?  -     Family Communication:  spoke with son on the phone Disposition Plan: To be determined  Time spent: 28 minutes  [place]

## 2017-07-13 NOTE — Progress Notes (Signed)
Pt has demonstrated difficulty with memory recall and decision making such as trying to contact family, friend or neighbor to car for cat that is alone in the home since yesterday. NIH-2. Pt states phones don't work in that he can't call the phone numbers; difficulty with recall. Becomes irritated when ask about calling some one for cat, " oh, just never mind; my son will take care of it" Pt informed that son lives 2 hours away and had not planned to come care for cat. Discussed with son concern of cat and states he is not coming and he will call his dad- advised son that pt is not thinking clearly at this time and may not be able to process and carry out task. States he will call his dad and talk to him.Social work is aware. CT MRA confirms 50-60% plaque in right neck.

## 2017-07-13 NOTE — Plan of Care (Signed)
Patient appears forgetful and confused at times.

## 2017-07-13 NOTE — Progress Notes (Signed)
PT Cancellation Note  Patient Details Name: JUVENAL UMAR MRN: [id number] DOB: 06-05-1931   Cancelled Treatment:    Reason Eval/Treat Not Completed: Patient at procedure or test/unavailable(Consult received and chart reviewed.  Patient currently off unit for diagnostic testing.  Will re-attempt at later time/date as medically appropriate and available.)   Thayer Inabinet H. Owens Shark, PT, DPT, NCS 07/13/17, 8:38 AM [phone]

## 2017-07-13 NOTE — Evaluation (Signed)
Speech Language Pathology Evaluation Patient Details Name: VERNER MCCRONE MRN: [id number] DOB: [birth year] Today's Date: 07/13/2017 Time: 9563-8756 SLP Time Calculation (min) (ACUTE ONLY): 60 min  Problem List:  Patient Active Problem List   Diagnosis Date Noted  . Acute encephalopathy 07/12/2017   Past Medical History:  Past Medical History:  Diagnosis Date  . [facility])    Prostate  . Hypertension    Past Surgical History:  Past Surgical History:  Procedure Laterality Date  . APPENDECTOMY    . FEMUR SURGERY    . FRACTURE SURGERY    . HIP FRACTURE SURGERY     HPI:  presented to ER secondary to AMS; admitted for management of acute encephalopathy, likely hypertensive per chart.  CTH negative for acute intracranial process.  MRI shows: 1. No acute intracranial abnormality.  2. Mild-to-moderate cerebral atrophy and minimal chronic small  vessel ischemic disease.  Prior level of function was independent at home, providing companionship to wife in nursing home.   Assessment / Plan / Recommendation Clinical Impression  84 year old man with acute onset of confusion is presenting with moderate cognitive communication deficits per screening with the [facility] Cognitive Assessment (scored 15/30 with cut-off point ? 26/30).  The patient is demonstrating difficulty with visuospatial/executive skills, immediate and short term memory, word fluency, abstract language, and orientation to place and time. It is not clear if the current cognitive deficits are of recent onset or chronic/ongoing deficits.  He may have been experiencing more cognitive decline than has been acknowledged.  He would benefit from ongoing cognitive assessment and treatment in his next setting.    SLP Assessment  SLP Recommendation/Assessment: Patient needs continued Speech Lanaguage Pathology Services SLP Visit Diagnosis: Cognitive communication deficit (R41.841)    Follow Up Recommendations  (SLP f/u in next  setting)    Frequency and Duration min 1 x/week         SLP Evaluation Cognition  Overall Cognitive Status: Impaired/Different from baseline(MOCA 8.1 administered ) Arousal/Alertness: Awake/alert Orientation Level: Oriented to person;Disoriented to time;Disoriented to place     Montreal Cognitive Assessment (MOCA) Version: 8.1 Visuospatieal/Executive Alternating trail making       1/1 Visuoconstruction Skills (copy 3-d design) 0/1 Draw a clock     2/3 Naming     3/3 Attention Forward digit span    1/1 Backward digit span    1/1 Vigilance     1/1 Serial 7's     2/3 Language  Verbal Fluency     0/1 Repetition     2/2 Abstraction     0/2 Delayed Recall    0/5  Memory Index Score   2/15 Orientation     2/6 TOTAL      15/30       Normal  ? 26/30     Comprehension       Expression     Oral / Motor  Oral Motor/Sensory Function Overall Oral Motor/Sensory Function: Within functional limits Motor Speech Overall Motor Speech: Appears within functional limits for tasks assessed   GO                   Leroy Sea, MS/CCC- SLP  Valetta Fuller, Susie 07/13/2017, 1:05 PM

## 2017-07-13 NOTE — ED Notes (Signed)
Dr. Maier in rm 

## 2017-07-13 NOTE — Plan of Care (Signed)
Patient is a new admit to the unit for stroke work up. Routine admission care provided. NIH completed, neuro check being done every 2 hours. Safety measures in place. Vital signs closely being monitored. Orientation to the unit provided. Needs attended.

## 2017-07-13 NOTE — Progress Notes (Signed)
OT Cancellation Note  Patient Details Name: Matthew Andersen MRN: [id number] DOB: October 17, 1931   Cancelled Treatment:    Reason Eval/Treat Not Completed: Other (comment). On 2nd attempt, SLP in to evaluate for cognition. Will re-attempt OT evaluation at later date/time as pt is available.  Jeni Salles, MPH, MS, OTR/L ascom [phone] 07/13/17, 12:17 PM

## 2017-07-13 NOTE — Clinical Social Work Note (Addendum)
CSW received consult for possible placement. PT is pending. The patient seems not to have insurance on file which would be a barrier to placement. CSW will assess pending PT evaluation.  UPDATE: PT is recommending SNF, and the patient's insurance has been updated. CSW will assess when able.  Santiago Bumpers, MSW, Latanya Presser [phone]

## 2017-07-14 DIAGNOSIS — F0390 Unspecified dementia without behavioral disturbance: Secondary | ICD-10-CM

## 2017-07-14 DIAGNOSIS — R4182 Altered mental status, unspecified: Secondary | ICD-10-CM

## 2017-07-14 DIAGNOSIS — F03A0 Unspecified dementia, mild, without behavioral disturbance, psychotic disturbance, mood disturbance, and anxiety: Secondary | ICD-10-CM

## 2017-07-14 LAB — VITAMIN B12: Vitamin B-12: 455 pg/mL (ref 180–914)

## 2017-07-14 NOTE — NC FL2 (Signed)
[place] LEVEL OF CARE SCREENING TOOL     IDENTIFICATION  Patient Name: Matthew Andersen Birthdate: 10/22/1931 Sex: male Admission Date (Current Location): 07/12/2017  [place] and [place] Number:  Engineering geologist and Address:  [facility], [address], [place], [place] [address]      Provider Number: [id number]  Attending Physician Name and Address:  Loletha Grayer, MD  Relative Name and Phone Number:  Dodd Schmid ([phone]    Current Level of Care: Hospital Recommended Level of Care: [place] Prior Approval Number:    Date Approved/Denied:   PASRR Number: [id number]  Discharge Plan: SNF    Current Diagnoses: Patient Active Problem List   Diagnosis Date Noted  . Mild dementia 07/14/2017  . Pressure injury of skin 07/13/2017  . Acute encephalopathy 07/12/2017    Orientation RESPIRATION BLADDER Height & Weight     Self, Time, Situation, Place  Normal Continent Weight: 180 lb (81.6 kg) Height:  6' (182.9 cm)  BEHAVIORAL SYMPTOMS/MOOD NEUROLOGICAL BOWEL NUTRITION STATUS      Continent Diet(Heart healthy)  AMBULATORY STATUS COMMUNICATION OF NEEDS Skin   Extensive Assist Verbally Normal                       Personal Care Assistance Level of Assistance  Bathing, Feeding, Dressing Bathing Assistance: Limited assistance Feeding assistance: Independent Dressing Assistance: Limited assistance     Functional Limitations Info  Sight, Hearing, Speech Sight Info: Adequate Hearing Info: Adequate Speech Info: Adequate    SPECIAL CARE FACTORS FREQUENCY  PT (By licensed PT), OT (By licensed OT)     PT Frequency: 5X per week OT Frequency: 3X per week            Contractures Contractures Info: Not present    Additional Factors Info  Code Status, Allergies Code Status Info: Full Allergies Info: Cefzil Cefprozil, Diphenhydramine, Pseudoephedrine           Current Medications  (07/14/2017):  This is the current hospital active medication list Current Facility-Administered Medications  Medication Dose Route Frequency Provider Last Rate Last Dose  . acetaminophen (TYLENOL) tablet 650 mg  650 mg Oral Q4H PRN Amelia Jo, MD       Or  . acetaminophen (TYLENOL) solution 650 mg  650 mg Per Tube Q4H PRN Amelia Jo, MD       Or  . acetaminophen (TYLENOL) suppository 650 mg  650 mg Rectal Q4H PRN Amelia Jo, MD      . amLODipine (NORVASC) tablet 5 mg  5 mg Oral Daily Amelia Jo, MD   5 mg at 07/14/17 0736   And  . benazepril (LOTENSIN) tablet 20 mg  20 mg Oral Daily Amelia Jo, MD   20 mg at 07/14/17 0737  . aspirin EC tablet 81 mg  81 mg Oral Daily Amelia Jo, MD   81 mg at 07/14/17 0738  . budesonide (PULMICORT) nebulizer solution 0.25 mg  0.25 mg Nebulization BID Amelia Jo, MD   0.25 mg at 07/14/17 0718  . guaiFENesin-dextromethorphan (ROBITUSSIN DM) 100-10 MG/5ML syrup 5 mL  5 mL Oral Q4H PRN Amelia Jo, MD   5 mL at 07/13/17 0235  . heparin injection 5,000 Units  5,000 Units Subcutaneous Q8H Amelia Jo, MD   5,000 Units at 07/14/17 1456  . hydrALAZINE (APRESOLINE) injection 5 mg  5 mg Intravenous Q4H PRN Arta Silence, MD      . pravastatin (PRAVACHOL) tablet 20 mg  20 mg Oral q1800 Amelia Jo, MD   20 mg at 07/13/17 1836  . senna-docusate (Senokot-S) tablet 1 tablet  1 tablet Oral QHS PRN Amelia Jo, MD      . sodium chloride flush (NS) 0.9 % injection 3 mL  3 mL Intravenous Q12H Loletha Grayer, MD   3 mL at 07/14/17 0741  . sodium chloride flush (NS) 0.9 % injection 3 mL  3 mL Intravenous PRN Loletha Grayer, MD   3 mL at 07/13/17 1245     Discharge Medications: Please see discharge summary for a list of discharge medications.  Relevant Imaging Results:  Relevant Lab Results:   Additional Information SS# [id number]  Zettie Pho, LCSW

## 2017-07-14 NOTE — Evaluation (Signed)
Occupational Therapy Evaluation Patient Details Name: Matthew Andersen MRN: [id number] DOB: 1931-03-02 Today's Date: 07/14/2017    History of Present Illness presented to ER secondary to AMS; admitted for management of acute encephalopathy, likely hypertensive per chart.  CTH negative for acute intracranial process; MRI pending.   Clinical Impression   Met with pt up in chair eating lunch, agreeable to OT this date. Pt feeding self independently. No functional t/fs completed to allow pt to finish engaging in meal. Pt able to write on piece of paper, no Willis noted in dominant hand. ROM/MMT in [place] for tasks observed. Per chart review, pt with minimal functional mobility deficits, most concerns lie in cognition/safety awareness. Pt telling OT several previous stories about him and his wife with timelines not lining up, asking for specific PT from another venue, saying wife cannot go home but wants wife to go home. When asked orientation questions pt oriented to self and situation. Pt reports year is 2009 and it's June 20-21st. When asked location pt stated [facility] hospital in [place]. When asked how to reach emergency help pt replies: "I'd call I nurse I know, or 911 but they could not answer". Pt with no insight or awareness to safety deficits, which raises concern for IADL completion in the home and community. Pt seems to have been devising compensatory strategies to mask cognitive deficits (reports using car beeper or trunk opener to find vehicle in crowded parking lot, etc.) Significant safety concerns for pt to return home alone. Will benefit from OT services to continue discovering which ADL/IADL most affected by safety.    Follow Up Recommendations  SNF;Supervision - Intermittent (possible placement to allow for safety supervision of pt)   Equipment Recommendations       Recommendations for Other Services       Precautions / Restrictions Precautions Precautions:  Fall Restrictions Weight Bearing Restrictions: No      Mobility Bed Mobility Overal bed mobility: Modified Independent                Transfers                 General transfer comment: pt up in chair eating lunch this date, did not further transfers to allow pt to finish meal    Balance Overall balance assessment: No apparent balance deficits (not formally assessed)                                         ADL either performed or assessed with clinical judgement   ADL Overall ADL's : Needs assistance/impaired Eating/Feeding: Independent   Grooming: Set up   Upper Body Bathing: Set up   Lower Body Bathing: Set up   Upper Body Dressing : Set up   Lower Body Dressing: Set up   Toilet Transfer: Supervision/safety   Toileting- Clothing Manipulation and Hygiene: Supervision/safety   Tub/ Shower Transfer: Supervision/safety   Functional mobility during ADLs: Supervision/safety;Cueing for safety General ADL Comments: Pt needing supervision/safety due to significant periods of confusion and lack of safety awareness to perform ADL/IADL.     Vision Baseline Vision/History: Wears glasses Wears Glasses: At all times Patient Visual Report: No change from baseline       Perception     Praxis      Pertinent Vitals/Pain Pain Assessment: No/denies pain     Hand Dominance     Extremity/Trunk  Assessment Upper Extremity Assessment Upper Extremity Assessment: Overall WFL for tasks assessed(grossly 4/5)   Lower Extremity Assessment Lower Extremity Assessment: Overall WFL for tasks assessed       Communication Communication Communication: No difficulties   Cognition Arousal/Alertness: Awake/alert Behavior During Therapy: WFL for tasks assessed/performed Overall Cognitive Status: Impaired/Different from baseline Area of Impairment: Memory;Safety/judgement;Orientation                 Orientation Level: Person;Situation    Memory: Decreased short-term memory   Safety/Judgement: Decreased awareness of safety;Decreased awareness of deficits     General Comments: Pt able to recall self and DOB and knows he is in hospital. Pt able to recall president. Unable to specify hospital, unable to specify year or date   General Comments       Exercises     Shoulder Instructions      Home Living Family/patient expects to be discharged to:: Private residence Living Arrangements: Alone   Type of Home: House Home Access: Stairs to enter [employer] of Steps: 5 Entrance Stairs-Rails: [place]: One [place]: Environmental consultant - 2 wheels;Cane - single point          Prior Functioning/Environment Level of Independence: Independent        Comments: independent, but questionable how well pt has been functioning with safety- little insight of safety awareness- seems to be compensating. Was visiting wife daily (driving) in SNF/LTC        OT Problem List: Decreased cognition;Decreased safety awareness      OT Treatment/Interventions: Self-care/ADL training;Cognitive remediation/compensation    OT Goals(Current goals can be found in the care plan section) Acute Rehab OT Goals Patient Stated Goal: to go home OT Goal Formulation: With patient Time For Goal Achievement: 08/11/17 Potential to Achieve Goals: Good  OT Frequency: Min 2X/week   Barriers to D/C: Decreased caregiver support  pt currently lives alone and not many family members near by for support, concerns for in home safety while alone       Co-evaluation              AM-PAC PT "6 Clicks" Daily Activity     Outcome Measure Help from another person eating meals?: None Help from another person taking care of personal grooming?: None Help from another person toileting, which includes using toliet, bedpan, or urinal?: A Little Help from another person bathing (including washing, rinsing,  drying)?: A Little Help from another person to put on and taking off regular upper body clothing?: A Little Help from another person to put on and taking off regular lower body clothing?: A Little 6 Click Score: 20   End of Session    Activity Tolerance: Patient tolerated treatment well Patient left: in chair;with call bell/phone within reach;with chair alarm set  OT Visit Diagnosis: Other symptoms and signs involving cognitive function                Time: 1200-1220 OT Time Calculation (min): 20 min Charges:  OT General Charges $OT Visit: 1 Visit OT Evaluation $OT Eval Low Complexity: 1 Low G-Codes:     Zenovia Jarred, MSOT, OTR/L  [place] 07/14/2017, 12:52 PM

## 2017-07-14 NOTE — Consult Note (Signed)
[facility] Face-to-Face Psychiatry Consult   Reason for Consult: Consult for 84 year old man brought to the hospital with acute confusion and hypertension.  Concern about capacity. Referring Physician: Bobbye Charleston Patient Identification: Matthew Andersen MRN:  [id number] Principal Diagnosis: Mild dementia Diagnosis:   Patient Active Problem List   Diagnosis Date Noted  . Mild dementia [F03.90] 07/14/2017  . Pressure injury of skin [L89.90] 07/13/2017  . Acute encephalopathy [G93.40] 07/12/2017    Total Time spent with patient: 1 hour  Subjective:   Matthew Andersen is a 84 y.o. male patient admitted with "somebody thought that I was dangerous".  HPI: Patient seen chart reviewed.  84 year old man brought to the hospital day before yesterday by EMS.  Apparently he had gone to visit his wife at her assisted living facility and staff had seen him in the parking lot and thought that he looked confused.  911 was called and the patient was found to have extremely high blood pressure and was agreeable to coming to the hospital.  Since then concern has been raised about his cognitive condition.  Patient was a bit defensive but ultimately cooperative enough with my interview I think.  He is able to tell me the circumstances of how he came to the hospital although his interpretation of them is pretty facetious.  He is currently oriented to where he is and to the month although he misspoke the year.  Patient says his mood is chronically a little down about his wife but not hopelessly sad.  Denies ever having any suicidal thoughts.  Denies ever having any hallucinations.  Claims that he has not had any concerns about taking care of himself at home.  Patient says that at home he occasionally uses a walker but does not take one with him when he goes out driving.  Since being here is been documented by some observers that he was quite confused and disoriented at times.  Physical therapy saw him yesterday morning and judge  that he would be appropriate for skilled nursing facility.  Social history: Patient lives by himself.  His wife is in a assisted living facility because of severe neurologic injury.  Patient has been by himself at home for about 3 years.  Only son lives in [place].  Patient is a retired Chief Financial Officer.  Says that he gets assistance in his living situation from friends and neighbors.  Medical history: High blood pressure.  Patient does not know the names of his medicines.  He admits that he has not been getting them filled regularly and not been taking care of his blood pressure.  He is not sure what other medical problems he has.  Substance abuse history: Denies alcohol or drug abuse  Past Psychiatric History: Patient has no known past psychiatric history.  No history of psychiatric hospitalization never been on psychiatric medicine no history of suicide attempts  Risk to Self: Is patient at risk for suicide?: No Risk to Others:   Prior Inpatient Therapy:   Prior Outpatient Therapy:    Past Medical History:  Past Medical History:  Diagnosis Date  . Cancer [facility])    Prostate  . Hypertension     Past Surgical History:  Procedure Laterality Date  . APPENDECTOMY    . FEMUR SURGERY    . FRACTURE SURGERY    . HIP FRACTURE SURGERY     Family History: No family history on file. Family Psychiatric  History: His father committed suicide but he knows little else about the history  Social History:  Social History   Substance and Sexual Activity  Alcohol Use Not Currently     Social History   Substance and Sexual Activity  Drug Use Not on file    Social History   Socioeconomic History  . Marital status: Married    Spouse name: Not on file  . Number of children: Not on file  . Years of education: Not on file  . Highest education level: Not on file  Occupational History  . Not on file  Social Needs  . Financial resource strain: Not on file  . Food insecurity:    Worry: Not on file     Inability: Not on file  . Transportation needs:    Medical: Not on file    Non-medical: Not on file  Tobacco Use  . Smoking status: Former Research scientist (life sciences)  . Smokeless tobacco: Never Used  Substance and Sexual Activity  . Alcohol use: Not Currently  . Drug use: Not on file  . Sexual activity: Not on file  Lifestyle  . Physical activity:    Days per week: Not on file    Minutes per session: Not on file  . Stress: Not on file  Relationships  . Social connections:    Talks on phone: Not on file    Gets together: Not on file    Attends religious service: Not on file    Active member of club or organization: Not on file    Attends meetings of clubs or organizations: Not on file    Relationship status: Not on file  Other Topics Concern  . Not on file  Social History Narrative  . Not on file   Additional Social History:    Allergies:   Allergies  Allergen Reactions  . Cefzil [Cefprozil]   . Diphenhydramine   . Pseudoephedrine     Labs:  Results for orders placed or performed during the hospital encounter of 07/12/17 (from the past 48 hour(s))  Urinalysis, Complete w Microscopic     Status: Abnormal   Collection Time: 07/12/17  9:31 PM  Result Value Ref Range   Color, Urine YELLOW (A) YELLOW   APPearance CLEAR (A) CLEAR   Specific Gravity, Urine 1.024 1.005 - 1.030   pH 5.0 5.0 - 8.0   Glucose, UA NEGATIVE NEGATIVE mg/dL   Hgb urine dipstick SMALL (A) NEGATIVE   Bilirubin Urine NEGATIVE NEGATIVE   Ketones, ur 20 (A) NEGATIVE mg/dL   Protein, ur NEGATIVE NEGATIVE mg/dL   Nitrite NEGATIVE NEGATIVE   Leukocytes, UA NEGATIVE NEGATIVE   RBC / HPF 0-5 0 - 5 RBC/hpf   WBC, UA 0-5 0 - 5 WBC/hpf   Bacteria, UA NONE SEEN NONE SEEN   Squamous Epithelial / LPF 0-5 0 - 5   Mucus PRESENT    Hyaline Casts, UA PRESENT     Comment: Performed at [facility], [address] [place], [place] [address]  CBC with Differential     Status: Abnormal   Collection Time: 07/12/17   9:31 PM  Result Value Ref Range   WBC 6.5 3.8 - 10.6 K/uL   RBC 4.52 4.40 - 5.90 MIL/uL   Hemoglobin 13.9 13.0 - 18.0 g/dL   HCT 41.6 40.0 - 52.0 %   MCV 92.0 80.0 - 100.0 fL   MCH 30.8 26.0 - 34.0 pg   MCHC 33.4 32.0 - 36.0 g/dL   RDW 15.0 (H) 11.5 - 14.5 %   Platelets 255 150 - 440 K/uL  Neutrophils Relative % 75 %   Neutro Abs 4.9 1.4 - 6.5 K/uL   Lymphocytes Relative 10 %   Lymphs Abs 0.7 (L) 1.0 - 3.6 K/uL   Monocytes Relative 10 %   Monocytes Absolute 0.7 0.2 - 1.0 K/uL   Eosinophils Relative 4 %   Eosinophils Absolute 0.3 0 - 0.7 K/uL   Basophils Relative 1 %   Basophils Absolute 0.1 0 - 0.1 K/uL    Comment: Performed at [facility], [address], [place], [place] [address]  Ethanol     Status: None   Collection Time: 07/12/17  9:31 PM  Result Value Ref Range   Alcohol, Ethyl (B) <10 <10 mg/dL    Comment: (NOTE) Lowest detectable limit for serum alcohol is 10 mg/dL. For medical purposes only. Performed at [facility], [address] [place], [place] [address]   Comprehensive metabolic panel     Status: Abnormal   Collection Time: 07/12/17  9:31 PM  Result Value Ref Range   Sodium 139 135 - 145 mmol/L   Potassium 3.8 3.5 - 5.1 mmol/L   Chloride 108 101 - 111 mmol/L   CO2 21 (L) 22 - 32 mmol/L   Glucose, Bld 107 (H) 65 - 99 mg/dL   BUN 33 (H) 6 - 20 mg/dL   Creatinine, Ser 1.03 0.61 - 1.24 mg/dL   Calcium 9.1 8.9 - 10.3 mg/dL   Total Protein 7.1 6.5 - 8.1 g/dL   Albumin 4.1 3.5 - 5.0 g/dL   AST 37 15 - 41 U/L   ALT 15 (L) 17 - 63 U/L   Alkaline Phosphatase 57 38 - 126 U/L   Total Bilirubin 0.7 0.3 - 1.2 mg/dL   GFR calc non Af Amer >60 >60 mL/min   GFR calc Af Amer >60 >60 mL/min    Comment: (NOTE) The eGFR has been calculated using the CKD EPI equation. This calculation has not been validated in all clinical situations. eGFR's persistently <60 mL/min signify possible Chronic Kidney Disease.    Anion gap 10 5 - 15    Comment:  Performed at [facility], [address] [place], [place] [address]  Troponin I     Status: None   Collection Time: 07/12/17  9:31 PM  Result Value Ref Range   Troponin I <0.03 <0.03 ng/mL    Comment: Performed at [facility], [address] [place],  [address]  Urine Drug Screen, Qualitative     Status: Abnormal   Collection Time: 07/12/17  9:31 PM  Result Value Ref Range   Tricyclic, Ur Screen NONE DETECTED NONE DETECTED   Amphetamines, Ur Screen NONE DETECTED NONE DETECTED   MDMA (Ecstasy)Ur Screen NONE DETECTED NONE DETECTED   Cocaine Metabolite,Ur [place] NONE DETECTED NONE DETECTED   Opiate, Ur Screen NONE DETECTED NONE DETECTED   Phencyclidine (PCP) Ur S NONE DETECTED NONE DETECTED   Cannabinoid 50 Ng, Ur [place] NONE DETECTED NONE DETECTED   Barbiturates, Ur Screen (A) NONE DETECTED    Result not available. Reagent lot number recalled by manufacturer.   Benzodiazepine, Ur Scrn NONE DETECTED NONE DETECTED   Methadone Scn, Ur NONE DETECTED NONE DETECTED    Comment: (NOTE) Tricyclics + metabolites, urine    Cutoff 1000 ng/mL Amphetamines + metabolites, urine  Cutoff 1000 ng/mL MDMA (Ecstasy), urine              Cutoff 500 ng/mL Cocaine Metabolite, urine  Cutoff 300 ng/mL Opiate + metabolites, urine        Cutoff 300 ng/mL Phencyclidine (PCP), urine         Cutoff 25 ng/mL Cannabinoid, urine                 Cutoff 50 ng/mL Barbiturates + metabolites, urine  Cutoff 200 ng/mL Benzodiazepine, urine              Cutoff 200 ng/mL Methadone, urine                   Cutoff 300 ng/mL The urine drug screen provides only a preliminary, unconfirmed analytical test result and should not be used for non-medical purposes. Clinical consideration and professional judgment should be applied to any positive drug screen result due to possible interfering substances. A more specific alternate chemical method must be used in order to obtain a confirmed analytical  result. Gas chromatography / mass spectrometry (GC/MS) is the preferred confirmat ory method. Performed at [facility], [address] [place], [place] [address]   Vitamin B12     Status: None   Collection Time: 07/12/17  9:31 PM  Result Value Ref Range   Vitamin B-12 455 180 - 914 pg/mL    Comment: (NOTE) This assay is not validated for testing neonatal or myeloproliferative syndrome specimens for Vitamin B12 levels. Performed at [facility], [place] [address] [place], [place] [address]   Hemoglobin A1c     Status: None   Collection Time: 07/13/17  4:25 AM  Result Value Ref Range   Hgb A1c MFr Bld 5.0 4.8 - 5.6 %    Comment: (NOTE) Pre diabetes:          5.7%-6.4% Diabetes:              >6.4% Glycemic control for   <7.0% adults with diabetes    Mean Plasma Glucose 96.8 mg/dL    Comment: Performed at [facility] [address] [place], [place] [address]  Lipid panel     Status: None   Collection Time: 07/13/17  4:25 AM  Result Value Ref Range   Cholesterol 187 0 - 200 mg/dL   Triglycerides 33 <150 mg/dL   HDL 82 >40 mg/dL   Total CHOL/HDL Ratio 2.3 RATIO   VLDL 7 0 - 40 mg/dL   LDL Cholesterol 98 0 - 99 mg/dL    Comment:        Total Cholesterol/HDL:CHD Risk Coronary Heart Disease Risk Table                     Men   Women  1/2 Average Risk   3.4   3.3  Average Risk       5.0   4.4  2 X Average Risk   9.6   7.1  3 X Average Risk  23.4   11.0        Use the calculated Patient Ratio above and the CHD Risk Table to determine the patient's CHD Risk.        ATP III CLASSIFICATION (LDL):  <100     mg/dL   Optimal  100-129  mg/dL   Near or Above                    Optimal  130-159  mg/dL   Borderline  160-189  mg/dL   High  >190     mg/dL   Very High  Performed at [facility], [address] [place], [place] [address]   TSH     Status: None   Collection Time: 07/13/17  4:25 AM  Result Value Ref Range   TSH 1.415 0.350 -  4.500 uIU/mL    Comment: Performed by a 3rd Generation assay with a functional sensitivity of <=0.01 uIU/mL. Performed at [facility], [address] [place], [place] [address]     Current Facility-Administered Medications  Medication Dose Route Frequency Provider Last Rate Last Dose  . acetaminophen (TYLENOL) tablet 650 mg  650 mg Oral Q4H PRN Amelia Jo, MD       Or  . acetaminophen (TYLENOL) solution 650 mg  650 mg Per Tube Q4H PRN Amelia Jo, MD       Or  . acetaminophen (TYLENOL) suppository 650 mg  650 mg Rectal Q4H PRN Amelia Jo, MD      . amLODipine (NORVASC) tablet 5 mg  5 mg Oral Daily Amelia Jo, MD   5 mg at 07/14/17 0736   And  . benazepril (LOTENSIN) tablet 20 mg  20 mg Oral Daily Amelia Jo, MD   20 mg at 07/14/17 0737  . aspirin EC tablet 81 mg  81 mg Oral Daily Amelia Jo, MD   81 mg at 07/14/17 0738  . budesonide (PULMICORT) nebulizer solution 0.25 mg  0.25 mg Nebulization BID Amelia Jo, MD   0.25 mg at 07/14/17 0718  . guaiFENesin-dextromethorphan (ROBITUSSIN DM) 100-10 MG/5ML syrup 5 mL  5 mL Oral Q4H PRN Amelia Jo, MD   5 mL at 07/13/17 0235  . heparin injection 5,000 Units  5,000 Units Subcutaneous Q8H Amelia Jo, MD   5,000 Units at 07/14/17 0553  . hydrALAZINE (APRESOLINE) injection 5 mg  5 mg Intravenous Q4H PRN Arta Silence, MD      . pravastatin (PRAVACHOL) tablet 20 mg  20 mg Oral q1800 Amelia Jo, MD   20 mg at 07/13/17 1836  . senna-docusate (Senokot-S) tablet 1 tablet  1 tablet Oral QHS PRN Amelia Jo, MD      . sodium chloride flush (NS) 0.9 % injection 3 mL  3 mL Intravenous Q12H Loletha Grayer, MD   3 mL at 07/14/17 0741  . sodium chloride flush (NS) 0.9 % injection 3 mL  3 mL Intravenous PRN Loletha Grayer, MD   3 mL at 07/13/17 1245    Musculoskeletal: Strength & Muscle Tone: decreased Gait & Station: unsteady Patient leans: N/A  Psychiatric Specialty Exam: Physical Exam  Nursing note and  vitals reviewed. Constitutional: He appears well-developed.  HENT:  Head: Normocephalic and atraumatic.  Eyes: Pupils are equal, round, and reactive to light. Conjunctivae are normal.  Neck: Normal range of motion.  Cardiovascular: Normal heart sounds.  Respiratory: Effort normal.  GI: Soft.  Musculoskeletal: Normal range of motion.  Neurological: He is alert.  Skin: Skin is warm and dry.  Psychiatric: His mood appears anxious. His speech is delayed and tangential. He is slowed. Thought content is not paranoid and not delusional. Cognition and memory are impaired. He expresses inappropriate judgment. He expresses no homicidal and no suicidal ideation. He exhibits abnormal recent memory.    Review of Systems  Constitutional: Negative.   HENT: Negative.   Eyes: Negative.   Respiratory: Negative.   Cardiovascular: Negative.   Gastrointestinal: Negative.   Musculoskeletal: Negative.   Skin: Negative.   Neurological: Negative.   Psychiatric/Behavioral: Negative for depression, hallucinations, memory loss, substance abuse and suicidal ideas. The patient  is not nervous/anxious and does not have insomnia.     Blood pressure (!) 165/73, pulse 69, temperature 98.6 F (37 C), temperature source Oral, resp. rate 20, height 6' (1.829 m), weight 81.6 kg (180 lb), SpO2 98 %.Body mass index is 24.41 kg/m.  General Appearance: Casual  Eye Contact:  Fair  Speech:  Slow  Volume:  Decreased  Mood:  Anxious and Dysphoric  Affect:  Constricted  Thought Process:  Disorganized  Orientation:  Full (Time, Place, and Person)  Thought Content:  Rumination and Tangential  Suicidal Thoughts:  No  Homicidal Thoughts:  No  Memory:  Immediate;   Fair Recent;   Fair Remote;   Fair  Judgement:  Impaired  Insight:  Shallow  Psychomotor Activity:  Decreased  Concentration:  Concentration: Fair  Recall:  [employer] of Knowledge:  Fair  Language:  Fair  Akathisia:  No  Handed:  Right  AIMS (if  indicated):     Assets:  Resilience  ADL's:  Impaired  Cognition:  Impaired,  Mild  Sleep:        Treatment Plan Summary: Plan This is an 84 year old man who was brought into the hospital with some acute confusion.  It seems from looking at the records like the confusion was worse yesterday and even earlier this morning and that it is probably gradually improving.  My examination of the patient showed him to be more intact than what is documented by some other providers earlier.  To my interview the patient is oriented to his current situation and is able to describe the circumstances around his admission pretty well.  He admits that he has been noncompliant with his high blood pressure medicine and refers to that as "poor judgment".  I tried to be as pleasant and nonconfrontational as possible to help him to be less defensive but he remains somewhat guarded.  He clearly has some deficits and slowing in his thinking and some deficits in executive functioning although he actually had pretty intact short-term memory to my evaluation.  As far as his capacity, I think there are some reasonable concerns.  The patient would not acknowledge that there was any reason why someone might possibly be concerned about his safety.  I think it is probably unrealistic the way that he is portraying how capable he has been of taking care of himself at home.  Right now I would certainly question his capacity to make the decision to go live completely independently.  I would at the least suggest that family be involved if possible with discharge planning.  No indication for any medications at this point.  I will continue to follow as needed.  Disposition: Patient does not meet criteria for psychiatric inpatient admission. Supportive therapy provided about ongoing stressors.  Alethia Berthold, MD 07/14/2017 2:07 PM

## 2017-07-14 NOTE — Clinical Social Work Note (Signed)
Clinical Social Work Assessment  Patient Details  Name: Matthew Andersen MRN: [id number] Date of Birth: Jun 21, 1931  Date of referral:  07/14/17               Reason for consult:  Facility Placement                Permission sought to share information with:  Chartered certified accountant granted to share information::  Yes, Verbal Permission Granted  Name::        Agency::  [facility]  Relationship::     Contact Information:     Housing/Transportation Living arrangements for the past 2 months:  [place] of Information:  Patient, Medical Team, Adult Children, Psychiatric Consultation Patient Interpreter Needed:  None Criminal Activity/Legal Involvement Pertinent to Current Situation/Hospitalization:  No - Comment as needed Significant Relationships:  Adult Children, Warehouse manager, Pets, Spouse, Neighbor Lives with:  Self Do you feel safe going back to the place where you live?  Yes Need for family participation in patient care:  Yes (Comment)(Patient's capacity is fluctuating.)  Care giving concerns:  PT recommendation for SNF; concern for capacity to make medical decisions/safe discharge planning   Social Worker assessment / plan:  The CSW met with the patient at bedside to discuss discharge planning. The patient shared that his wife is at [facility] "for short term" and has been there for 3 years after a spinal stenosis surgery that had complications resulting in an anoxic brain injury. The patient admitted that he would benefit from SNF, but "cannot go right now or have home health right now." When the CSW asked why he could not have the services, the patient shared that "I don't want to disappoint my wife."  The CSW spoke with the patient's son who reported that "the decision is up to my dad." The CSW explained that the patient is fluctuating in his choice in that he tells nursing staff that he wants to go to SNF, tells the CSW  that he wants to go home, and tells his MD that he "will leave today." The patient's son gave verbal permission to begin the referral.  The CSW has begun the referral. As the patient's spouse is at [facility], it may benefit the patient's mental health to go to that facility if possible. The CSW will follow up tomorrow with bed offers and further family discussion about the care needs.  Employment status:  Retired Forensic scientist:  [employer] PT Recommendations:  [place] / Referral to community resources:  [place]  Patient/Family's Response to care:  The patient thanked the CSW but is guarded when discussing discharge planning. The client seems to mask his symptoms and presents as inconsistent at times.   Patient/Family's Understanding of and Emotional Response to Diagnosis, Current Treatment, and Prognosis:  The patient has poor insight related to his care needs, and his son seems to be noncommittal with regards to advocating for appropriate care for his father.   Emotional Assessment Appearance:  Appears stated age Attitude/Demeanor/Rapport:  Guarded, Inconsistent, Gracious Affect (typically observed):  Stable Orientation:  Oriented to Self, Oriented to Place, Oriented to Situation Alcohol / Substance use:  Never Used Psych involvement (Current and /or in the community):  Yes (Comment)  Discharge Needs  Concerns to be addressed:  Care Coordination, Discharge Planning Concerns Readmission within the last 30 days:  No Current discharge risk:  Chronically ill, Lives alone, Cognitively Impaired, Lack  of support system Barriers to Discharge:  Continued Medical Work up   [employer], LCSW 07/14/2017, 3:24 PM

## 2017-07-14 NOTE — Progress Notes (Signed)
Pt called out wanting to be taken to his room while he was in his assigned room; he was sitting in chair with tired appearance. Pt reoriented and encouraged to go to bed for rest with coaching he did this. Confusion/forgetfulness  varies from mild to more intense. Pt becomes mildly agitated when trying to remember when he has difficulty with memory recall. VSS. Eating well. Impulsive setting off chair/bed alarms frequently despite repeated education to call first; forgets how to use call bell.  Frequents urinates with dribbling/intermittent stream- misses the toilet frequently. Neuro consult and psych consult done.

## 2017-07-14 NOTE — Consult Note (Signed)
Reason for Consult:confusion  Referring Physician: Dr. Leslye Peer   CC: confusion   HPI: Matthew Andersen is an 84 y.o. male with a known history of hypertension. He lives alone at home and he is usually independent and mentally very sharp.  He is still driving and was visiting his wife at the nursing home, earlier today, when he was noted to be confused. Pt presented with elevated BP SBP in the 190s.    Currently appears back to baseline.  MRI no acute abnormalities.      Past Medical History:  Diagnosis Date  . Cancer [facility])    Prostate  . Hypertension     Past Surgical History:  Procedure Laterality Date  . APPENDECTOMY    . FEMUR SURGERY    . FRACTURE SURGERY    . HIP FRACTURE SURGERY      No family history on file.  Social History:  reports that he has quit smoking. He has never used smokeless tobacco. He reports that he drank alcohol. His drug history is not on file.  Allergies  Allergen Reactions  . Cefzil [Cefprozil]   . Diphenhydramine   . Pseudoephedrine     Medications: I have reviewed the patient's current medications.    Physical Examination: Blood pressure (!) 165/73, pulse 69, temperature 98.6 F (37 C), temperature source Oral, resp. rate 20, height 6' (1.829 m), weight 180 lb (81.6 kg), SpO2 98 %.    Neurological Examination   Mental Status: Alert, oriented, thought content appropriate.  Confused about date and month Cranial Nerves: II: Discs flat bilaterally; Visual fields grossly normal, pupils equal, round, reactive to light and accommodation III,IV, VI: ptosis not present, extra-ocular motions intact bilaterally V,VII: smile symmetric, facial light touch sensation normal bilaterally VIII: hearing normal bilaterally IX,X: gag reflex present XI: bilateral shoulder shrug XII: midline tongue extension Motor: Right : Upper extremity   5/5    Left:     Upper extremity   5/5  Lower extremity   5/5     Lower extremity   5/5 Tone and bulk:normal  tone throughout; no atrophy noted Sensory: Pinprick and light touch intact throughout, bilaterally Deep Tendon Reflexes: 1+ and symmetric throughout Plantars: Right: downgoing   Left: downgoing Cerebellar: normal finger-to-nose Gait: not tested      Laboratory Studies:   Basic Metabolic Panel: Recent Labs  Lab 07/12/17 2131  NA 139  K 3.8  CL 108  CO2 21*  GLUCOSE 107*  BUN 33*  CREATININE 1.03  CALCIUM 9.1    Liver Function Tests: Recent Labs  Lab 07/12/17 2131  AST 37  ALT 15*  ALKPHOS 57  BILITOT 0.7  PROT 7.1  ALBUMIN 4.1   No results for input(s): LIPASE, AMYLASE in the last 168 hours. No results for input(s): AMMONIA in the last 168 hours.  CBC: Recent Labs  Lab 07/12/17 2131  WBC 6.5  NEUTROABS 4.9  HGB 13.9  HCT 41.6  MCV 92.0  PLT 255    Cardiac Enzymes: Recent Labs  Lab 07/12/17 2131  TROPONINI <0.03    BNP: Invalid input(s): POCBNP  CBG: No results for input(s): GLUCAP in the last 168 hours.  Microbiology: No results found for this or any previous visit.  Coagulation Studies: No results for input(s): LABPROT, INR in the last 72 hours.  Urinalysis:  Recent Labs  Lab 07/12/17 2131  COLORURINE YELLOW*  LABSPEC 1.024  PHURINE 5.0  GLUCOSEU NEGATIVE  HGBUR SMALL*  BILIRUBINUR NEGATIVE  KETONESUR 20*  PROTEINUR NEGATIVE  NITRITE NEGATIVE  LEUKOCYTESUR NEGATIVE    Lipid Panel:     Component Value Date/Time   CHOL 187 07/13/2017 0425   TRIG 33 07/13/2017 0425   HDL 82 07/13/2017 0425   CHOLHDL 2.3 07/13/2017 0425   VLDL 7 07/13/2017 0425   LDLCALC 98 07/13/2017 0425    HgbA1C:  Lab Results  Component Value Date   HGBA1C 5.0 07/13/2017    Urine Drug Screen:      Component Value Date/Time   LABOPIA NONE DETECTED 07/12/2017 2131   COCAINSCRNUR NONE DETECTED 07/12/2017 2131   LABBENZ NONE DETECTED 07/12/2017 2131   AMPHETMU NONE DETECTED 07/12/2017 2131   THCU NONE DETECTED 07/12/2017 2131   LABBARB (A)  07/12/2017 2131    Result not available. Reagent lot number recalled by manufacturer.    Alcohol Level:  Recent Labs  Lab 07/12/17 2131  ETH <10    Other results: EKG: normal EKG, normal sinus rhythm, unchanged from previous tracings.  Imaging: Dg Chest 2 View  Result Date: 07/12/2017 CLINICAL DATA:  Cough for a week.  Possible altered mental status. EXAM: CHEST - 2 VIEW COMPARISON:  None. FINDINGS: Mild hyperinflation. Normal heart size and pulmonary vascularity. No focal airspace disease or consolidation in the lungs. No blunting of costophrenic angles. No pneumothorax. Mediastinal contours appear intact. Calcification of the aorta. Degenerative changes in the spine and shoulders. IMPRESSION: Mild hyperinflation. No evidence of active pulmonary disease. Aortic atherosclerosis. Electronically Signed   By: Lucienne Capers M.D.   On: 07/12/2017 21:39   Dg Pelvis 1-2 Views  Result Date: 07/13/2017 CLINICAL DATA:  MRI clearance. EXAM: PELVIS - 1-2 VIEW COMPARISON:  None. FINDINGS: Exam demonstrates a right total hip arthroplasty intact and normally located. There are degenerative changes of the spine and left hip. IMPRESSION: No acute findings. Right total hip arthroplasty intact. Electronically Signed   By: Marin Olp M.D.   On: 07/13/2017 09:49   Dg Abd 1 View  Result Date: 07/13/2017 CLINICAL DATA:  MRI clearance. EXAM: ABDOMEN - 1 VIEW COMPARISON:  None. FINDINGS: Examination demonstrates a nonobstructive bowel gas pattern with mild fecal retention throughout the colon. Moderate degenerate change of the spine. Mild degenerate change of the left hip. Right total hip arthroplasty is present. IMPRESSION: Nonobstructive bowel gas pattern with mild fecal retention throughout the colon. Partially visualized right hip arthroplasty intact. Electronically Signed   By: Marin Olp M.D.   On: 07/13/2017 09:49   Ct Head Wo Contrast  Result Date: 07/12/2017 CLINICAL DATA:  Per EMS, pt was  visiting his wife at [facility] where staff noticed pt was acting confused. EMS states pt's wife has been a resident there for 4 years however pt states she has been there 4 days, staff reports he was not able to locate his car. EMS states pt had difficulty answering year and DOB. Pt is able to answer DOB here at this time. EMS reports stroke screen negative. EXAM: CT HEAD WITHOUT CONTRAST TECHNIQUE: Contiguous axial images were obtained from the base of the skull through the vertex without intravenous contrast. COMPARISON:  None. FINDINGS: Brain: No evidence of acute infarction, hemorrhage, hydrocephalus, extra-axial collection or mass lesion/mass effect. There is ventricular and sulcal enlargement reflecting age-appropriate volume loss. Mild patchy areas of white matter hypoattenuation noted consistent with chronic microvascular ischemic change. Vascular: No hyperdense vessel or unexpected calcification. Skull: Normal. Negative for fracture or focal lesion. Sinuses/Orbits: Globes and orbits are unremarkable. Mild ethmoid sinus mucosal thickening.  Remaining visualized sinuses and mastoid air cells are clear. Other: None. IMPRESSION: 1. No acute intracranial abnormalities. 2. Age related volume loss. Mild chronic microvascular ischemic change. Electronically Signed   By: Lajean Manes M.D.   On: 07/12/2017 21:28   Ct Angio Neck W Or Wo Contrast  Result Date: 07/13/2017 CLINICAL DATA:  84 y/o M; altered mental status, encephalopathy, confusion. Carotid stenosis for follow-up. EXAM: CT ANGIOGRAPHY NECK TECHNIQUE: Multidetector CT imaging of the neck was performed using the standard protocol during bolus administration of intravenous contrast. Multiplanar CT image reconstructions and MIPs were obtained to evaluate the vascular anatomy. Carotid stenosis measurements (when applicable) are obtained utilizing NASCET criteria, using the distal internal carotid diameter as the denominator. CONTRAST:  22mL OMNIPAQUE  IOHEXOL 350 MG/ML SOLN COMPARISON:  07/13/2017 MRI head, carotid ultrasound. FINDINGS: Aortic arch: Standard branching. Imaged portion shows no evidence of aneurysm or dissection. No significant stenosis of the major arch vessel origins. Mild calcific atherosclerosis. Right carotid system: Calcified plaque of the right carotid bifurcation with moderate 50-60% proximal ICA stenosis (series 11, image 7). No dissection or aneurysm. Left carotid system: No evidence of dissection, stenosis (50% or greater) or occlusion. Mild calcific atherosclerosis of left carotid bifurcation without significant stenosis. Vertebral arteries: Right dominant. No evidence of dissection, stenosis (50% or greater) or occlusion. Skeleton: Cervical spondylosis with advanced multilevel facet arthropathy. No high-grade bony canal stenosis. No loss of vertebral body height. Other neck: Multiple thyroid nodules measuring up to 17 mm in the right lobe. Upper chest: Negative. IMPRESSION: 1. Right proximal ICA moderate 50-60% stenosis with calcified plaque. 2. Calcific atherosclerosis of aorta and left carotid bifurcation without significant stenosis. 3. Multiple thyroid nodules measuring up to 17 mm. Thyroid ultrasound is recommended on a nonemergent basis. Electronically Signed   By: Kristine Garbe M.D.   On: 07/13/2017 14:06   Mr Brain Wo Contrast  Result Date: 07/13/2017 CLINICAL DATA:  Altered mental status. EXAM: MRI HEAD WITHOUT CONTRAST TECHNIQUE: Multiplanar, multiecho pulse sequences of the brain and surrounding structures were obtained without intravenous contrast. COMPARISON:  Head CT 07/12/2017 FINDINGS: Brain: There is no evidence of acute infarct, intracranial hemorrhage, mass, midline shift, or extra-axial fluid collection. Generalized cerebral atrophy is mild-to-moderate for age. Scattered cerebral white matter T2 hyperintensities are nonspecific but compatible with chronic small vessel ischemic disease, minimal for  age. A chronic lacunar infarct is noted in the left thalamus. Vascular: Major intracranial vascular flow voids are preserved. Skull and upper cervical spine: No suspicious marrow lesion. Sinuses/Orbits: Bilateral cataract extraction. Mild bilateral ethmoid air cell mucosal thickening. Small right maxillary sinus mucous retention cyst. Clear mastoid air cells. Other: None. IMPRESSION: 1. No acute intracranial abnormality. 2. Mild-to-moderate cerebral atrophy and minimal chronic small vessel ischemic disease. Electronically Signed   By: Logan Bores M.D.   On: 07/13/2017 11:29   US Carotid Bilateral (at [facility] Only)  Result Date: 07/13/2017 CLINICAL DATA:  Acute encephalopathy. History of hypertension and smoking. EXAM: BILATERAL CAROTID DUPLEX ULTRASOUND TECHNIQUE: Pearline Cables scale imaging, color Doppler and duplex ultrasound were performed of bilateral carotid and vertebral arteries in the neck. COMPARISON:  None. FINDINGS: Criteria: Quantification of carotid stenosis is based on velocity parameters that correlate the residual internal carotid diameter with NASCET-based stenosis levels, using the diameter of the distal internal carotid lumen as the denominator for stenosis measurement. The following velocity measurements were obtained: RIGHT ICA:  148/21 cm/sec CCA:  18/29 cm/sec SYSTOLIC ICA/CCA RATIO:  1.7 ECA:  153 cm/sec LEFT  ICA:  96/17 cm/sec CCA:  244/01 cm/sec SYSTOLIC ICA/CCA RATIO:  0.9 ECA:  94 cm/sec RIGHT CAROTID ARTERY: There is a minimal amount of eccentric mixed echogenic plaque scattered throughout the right common carotid artery (images 3 and 7). There is a moderate amount of eccentric mixed echogenic plaque within the right carotid bulb (image 15). There is a large amount of eccentric echogenic densely shadowing plaque involving the origin and proximal aspects of the right internal carotid artery (image 23), resulting in elevated peak systolic velocities within the proximal aspect the right  internal carotid artery. Greatest acquired peak systolic velocity with the proximal right ICA measures 140 cm/sec - image 24. RIGHT VERTEBRAL ARTERY:  Antegrade Flow LEFT CAROTID ARTERY: There is a minimal amount of eccentric mixed echogenic plaque involving the origin and proximal aspects of the left internal carotid artery (image 55), not resulting in elevated peak systolic velocities within the interrogated course the left internal carotid artery to suggest a hemodynamically significant stenosis. LEFT VERTEBRAL ARTERY: Antegrade flow though monophasic waveform is demonstrated. IMPRESSION: 1. Moderate to large amount of right-sided atherosclerotic plaque results in elevated peak systolic velocities within the right internal carotid artery compatible with the 50 to 69% luminal narrowing range. Further evaluation with CTA could be performed as clinically indicated. 2. Minimal amount of left-sided atherosclerotic plaque, not resulting in a hemodynamically significant stenosis. Electronically Signed   By: Sandi Mariscal M.D.   On: 07/13/2017 08:36     Assessment/Plan:   84 y.o. male with a known history of hypertension. He lives alone at home and he is usually independent and mentally very sharp.  He is still driving and was visiting his wife at the nursing home, earlier today, when he was noted to be confused. Pt presented with elevated BP SBP in the 190s.    Currently appears back to baseline.  MRI no acute abnormalities.    - HTN encephalopathy that is improved.   - Baseline confusion and possibly dementia.  - Appreciate the lab work up of TSH, RPR, B12 - Needs assistance and don't feel safe to go home - Agree with rehab placement 07/14/2017, 1:20 PM

## 2017-07-14 NOTE — Progress Notes (Signed)
Patient ID: Matthew Andersen, male   DOB: 1931/05/08, 84 y.o.   MRN: [id number]  [facility] PROGRESS NOTE  [name] EXN:[id number] DOB: [birth year] DOA: 07/12/2017 PCP: Katheren Shams  HPI/Subjective: Patient feels better.  He states he has to get home today.  He offers no complaints.  Objective: Vitals:   07/14/17 0734 07/14/17 1155  BP: (!) 165/73 (!) 150/68  Pulse: 69 70  Resp: 20 18  Temp: 98.6 F (37 C) 98.6 F (37 C)  SpO2: 98% 99%    Filed Weights   07/12/17 2101  Weight: 81.6 kg (180 lb)    ROS: Review of Systems  Constitutional: Negative for chills and fever.  Eyes: Negative for blurred vision.  Respiratory: Negative for cough and shortness of breath.   Cardiovascular: Negative for chest pain.  Gastrointestinal: Negative for abdominal pain, constipation, diarrhea, nausea and vomiting.  Genitourinary: Negative for dysuria.  Musculoskeletal: Negative for joint pain.  Neurological: Negative for dizziness and headaches.   Exam: Physical Exam  HENT:  Nose: No mucosal edema.  Mouth/Throat: No oropharyngeal exudate or posterior oropharyngeal edema.  Eyes: Pupils are equal, round, and reactive to light. Conjunctivae, EOM and lids are normal.  Neck: No JVD present. Carotid bruit is not present. No edema present. No thyroid mass and no thyromegaly present.  Cardiovascular: S1 normal and S2 normal. Exam reveals no gallop.  No murmur heard. Pulses:      Dorsalis pedis pulses are 2+ on the right side, and 2+ on the left side.  Respiratory: No respiratory distress. He has no wheezes. He has no rhonchi. He has no rales.  GI: Soft. Bowel sounds are normal. There is no tenderness.  Musculoskeletal:       Right ankle: He exhibits no swelling.       Left ankle: He exhibits no swelling.  Lymphadenopathy:    He has no cervical adenopathy.  Neurological: He is alert. No cranial nerve deficit.  Power 5 out of 5 bilateral upper and lower extremities.   Skin: Skin is warm. No rash noted. Nails show no clubbing.  Psychiatric: He has a normal mood and affect.      Data Reviewed: Basic Metabolic Panel: Recent Labs  Lab 07/12/17 2131  NA 139  K 3.8  CL 108  CO2 21*  GLUCOSE 107*  BUN 33*  CREATININE 1.03  CALCIUM 9.1   Liver Function Tests: Recent Labs  Lab 07/12/17 2131  AST 37  ALT 15*  ALKPHOS 57  BILITOT 0.7  PROT 7.1  ALBUMIN 4.1   CBC: Recent Labs  Lab 07/12/17 2131  WBC 6.5  NEUTROABS 4.9  HGB 13.9  HCT 41.6  MCV 92.0  PLT 255   Cardiac Enzymes: Recent Labs  Lab 07/12/17 2131  TROPONINI <0.03     Studies: Dg Chest 2 View  Result Date: 07/12/2017 CLINICAL DATA:  Cough for a week.  Possible altered mental status. EXAM: CHEST - 2 VIEW COMPARISON:  None. FINDINGS: Mild hyperinflation. Normal heart size and pulmonary vascularity. No focal airspace disease or consolidation in the lungs. No blunting of costophrenic angles. No pneumothorax. Mediastinal contours appear intact. Calcification of the aorta. Degenerative changes in the spine and shoulders. IMPRESSION: Mild hyperinflation. No evidence of active pulmonary disease. Aortic atherosclerosis. Electronically Signed   By: Lucienne Capers M.D.   On: 07/12/2017 21:39   Dg Pelvis 1-2 Views  Result Date: 07/13/2017 CLINICAL DATA:  MRI clearance. EXAM: PELVIS - 1-2 VIEW COMPARISON:  None. FINDINGS: Exam  demonstrates a right total hip arthroplasty intact and normally located. There are degenerative changes of the spine and left hip. IMPRESSION: No acute findings. Right total hip arthroplasty intact. Electronically Signed   By: Marin Olp M.D.   On: 07/13/2017 09:49   Dg Abd 1 View  Result Date: 07/13/2017 CLINICAL DATA:  MRI clearance. EXAM: ABDOMEN - 1 VIEW COMPARISON:  None. FINDINGS: Examination demonstrates a nonobstructive bowel gas pattern with mild fecal retention throughout the colon. Moderate degenerate change of the spine. Mild degenerate change  of the left hip. Right total hip arthroplasty is present. IMPRESSION: Nonobstructive bowel gas pattern with mild fecal retention throughout the colon. Partially visualized right hip arthroplasty intact. Electronically Signed   By: Marin Olp M.D.   On: 07/13/2017 09:49   Ct Head Wo Contrast  Result Date: 07/12/2017 CLINICAL DATA:  Per EMS, pt was visiting his wife at [facility] where staff noticed pt was acting confused. EMS states pt's wife has been a resident there for 4 years however pt states she has been there 4 days, staff reports he was not able to locate his car. EMS states pt had difficulty answering year and DOB. Pt is able to answer DOB here at this time. EMS reports stroke screen negative. EXAM: CT HEAD WITHOUT CONTRAST TECHNIQUE: Contiguous axial images were obtained from the base of the skull through the vertex without intravenous contrast. COMPARISON:  None. FINDINGS: Brain: No evidence of acute infarction, hemorrhage, hydrocephalus, extra-axial collection or mass lesion/mass effect. There is ventricular and sulcal enlargement reflecting age-appropriate volume loss. Mild patchy areas of white matter hypoattenuation noted consistent with chronic microvascular ischemic change. Vascular: No hyperdense vessel or unexpected calcification. Skull: Normal. Negative for fracture or focal lesion. Sinuses/Orbits: Globes and orbits are unremarkable. Mild ethmoid sinus mucosal thickening. Remaining visualized sinuses and mastoid air cells are clear. Other: None. IMPRESSION: 1. No acute intracranial abnormalities. 2. Age related volume loss. Mild chronic microvascular ischemic change. Electronically Signed   By: Lajean Manes M.D.   On: 07/12/2017 21:28   Ct Angio Neck W Or Wo Contrast  Result Date: 07/13/2017 CLINICAL DATA:  84 y/o M; altered mental status, encephalopathy, confusion. Carotid stenosis for follow-up. EXAM: CT ANGIOGRAPHY NECK TECHNIQUE: Multidetector CT imaging of the neck was  performed using the standard protocol during bolus administration of intravenous contrast. Multiplanar CT image reconstructions and MIPs were obtained to evaluate the vascular anatomy. Carotid stenosis measurements (when applicable) are obtained utilizing NASCET criteria, using the distal internal carotid diameter as the denominator. CONTRAST:  67mL OMNIPAQUE IOHEXOL 350 MG/ML SOLN COMPARISON:  07/13/2017 MRI head, carotid ultrasound. FINDINGS: Aortic arch: Standard branching. Imaged portion shows no evidence of aneurysm or dissection. No significant stenosis of the major arch vessel origins. Mild calcific atherosclerosis. Right carotid system: Calcified plaque of the right carotid bifurcation with moderate 50-60% proximal ICA stenosis (series 11, image 7). No dissection or aneurysm. Left carotid system: No evidence of dissection, stenosis (50% or greater) or occlusion. Mild calcific atherosclerosis of left carotid bifurcation without significant stenosis. Vertebral arteries: Right dominant. No evidence of dissection, stenosis (50% or greater) or occlusion. Skeleton: Cervical spondylosis with advanced multilevel facet arthropathy. No high-grade bony canal stenosis. No loss of vertebral body height. Other neck: Multiple thyroid nodules measuring up to 17 mm in the right lobe. Upper chest: Negative. IMPRESSION: 1. Right proximal ICA moderate 50-60% stenosis with calcified plaque. 2. Calcific atherosclerosis of aorta and left carotid bifurcation without significant stenosis. 3. Multiple thyroid nodules  measuring up to 17 mm. Thyroid ultrasound is recommended on a nonemergent basis. Electronically Signed   By: Kristine Garbe M.D.   On: 07/13/2017 14:06   Mr Brain Wo Contrast  Result Date: 07/13/2017 CLINICAL DATA:  Altered mental status. EXAM: MRI HEAD WITHOUT CONTRAST TECHNIQUE: Multiplanar, multiecho pulse sequences of the brain and surrounding structures were obtained without intravenous contrast.  COMPARISON:  Head CT 07/12/2017 FINDINGS: Brain: There is no evidence of acute infarct, intracranial hemorrhage, mass, midline shift, or extra-axial fluid collection. Generalized cerebral atrophy is mild-to-moderate for age. Scattered cerebral white matter T2 hyperintensities are nonspecific but compatible with chronic small vessel ischemic disease, minimal for age. A chronic lacunar infarct is noted in the left thalamus. Vascular: Major intracranial vascular flow voids are preserved. Skull and upper cervical spine: No suspicious marrow lesion. Sinuses/Orbits: Bilateral cataract extraction. Mild bilateral ethmoid air cell mucosal thickening. Small right maxillary sinus mucous retention cyst. Clear mastoid air cells. Other: None. IMPRESSION: 1. No acute intracranial abnormality. 2. Mild-to-moderate cerebral atrophy and minimal chronic small vessel ischemic disease. Electronically Signed   By: Logan Bores M.D.   On: 07/13/2017 11:29   US Carotid Bilateral (at [facility] Only)  Result Date: 07/13/2017 CLINICAL DATA:  Acute encephalopathy. History of hypertension and smoking. EXAM: BILATERAL CAROTID DUPLEX ULTRASOUND TECHNIQUE: Pearline Cables scale imaging, color Doppler and duplex ultrasound were performed of bilateral carotid and vertebral arteries in the neck. COMPARISON:  None. FINDINGS: Criteria: Quantification of carotid stenosis is based on velocity parameters that correlate the residual internal carotid diameter with NASCET-based stenosis levels, using the diameter of the distal internal carotid lumen as the denominator for stenosis measurement. The following velocity measurements were obtained: RIGHT ICA:  148/21 cm/sec CCA:  42/35 cm/sec SYSTOLIC ICA/CCA RATIO:  1.7 ECA:  153 cm/sec LEFT ICA:  96/17 cm/sec CCA:  361/44 cm/sec SYSTOLIC ICA/CCA RATIO:  0.9 ECA:  94 cm/sec RIGHT CAROTID ARTERY: There is a minimal amount of eccentric mixed echogenic plaque scattered throughout the right common carotid artery (images 3  and 7). There is a moderate amount of eccentric mixed echogenic plaque within the right carotid bulb (image 15). There is a large amount of eccentric echogenic densely shadowing plaque involving the origin and proximal aspects of the right internal carotid artery (image 23), resulting in elevated peak systolic velocities within the proximal aspect the right internal carotid artery. Greatest acquired peak systolic velocity with the proximal right ICA measures 140 cm/sec - image 24. RIGHT VERTEBRAL ARTERY:  Antegrade Flow LEFT CAROTID ARTERY: There is a minimal amount of eccentric mixed echogenic plaque involving the origin and proximal aspects of the left internal carotid artery (image 55), not resulting in elevated peak systolic velocities within the interrogated course the left internal carotid artery to suggest a hemodynamically significant stenosis. LEFT VERTEBRAL ARTERY: Antegrade flow though monophasic waveform is demonstrated. IMPRESSION: 1. Moderate to large amount of right-sided atherosclerotic plaque results in elevated peak systolic velocities within the right internal carotid artery compatible with the 50 to 69% luminal narrowing range. Further evaluation with CTA could be performed as clinically indicated. 2. Minimal amount of left-sided atherosclerotic plaque, not resulting in a hemodynamically significant stenosis. Electronically Signed   By: Sandi Mariscal M.D.   On: 07/13/2017 08:36    Scheduled Meds: . amLODipine  5 mg Oral Daily   And  . benazepril  20 mg Oral Daily  . aspirin EC  81 mg Oral Daily  . budesonide (PULMICORT) nebulizer solution  0.25 mg  Nebulization BID  . heparin  5,000 Units Subcutaneous Q8H  . pravastatin  20 mg Oral q1800  . sodium chloride flush  3 mL Intravenous Q12H   Continuous Infusions:  Assessment/Plan:  1. Hypertensive encephalopathy.  Patient noncompliant with his blood pressure medications.  Blood pressure trending better on Norvasc and benazepril.   MRI of  the brain negative for stroke.  I am concerned about this patient living alone and taking care of himself at home.  He does have some cognitive decline.  He is able to answer questions and give some answers.  His short-term recall was 2 out of 3 words.  He did know the President of the [place].  He was able to do serial sevens subtractions for only 2 tries.  He was unable to spell the word world backwards. 2. Carotid stenosis on CT Angie of the neck not enough to be a surgical candidate. 3. Hyperlipidemia unspecified on pravastatin 4. History of prostate cancer. 5. Unsteady gait.  Physical therapy recommended rehab.  Tried to reach the son again today without luck. 6. Stage I decubitus ulcer on buttock, stage I ulceration left second toe  Code Status:     Code Status Orders  (From admission, onward)        Start     Ordered   07/13/17 0055  Full code  Continuous     07/13/17 0055    Code Status History    This patient has a current code status but no historical code status.    Advance Directive Documentation     Most Recent Value  Type of Advance Directive  Healthcare Power of Attorney  Pre-existing out of facility DNR order (yellow form or pink MOST form)  -  "MOST" Form in Place?  -     Family Communication: Tried to speak with the son on the phone.  I left a message. Disposition Plan: Physical therapy recommends rehab.  I am concerned about him living alone by himself with his cognition.  Tried to reach the family about discharge planning.  Hopefully the patient will agree to rehab tomorrow.  Time spent: 30 minutes  [place]

## 2017-07-15 LAB — RPR: RPR: NONREACTIVE

## 2017-07-15 MED ORDER — GUAIFENESIN-DM 100-10 MG/5ML PO SYRP: 5.0000 mL | ORAL_SOLUTION | ORAL | 0 refills | Status: AC | PRN

## 2017-07-15 MED ORDER — PRAVASTATIN SODIUM 20 MG PO TABS: 20.0000 mg | ORAL_TABLET | Freq: Every day | ORAL | 0 refills | Status: AC

## 2017-07-15 MED ORDER — ACETAMINOPHEN 325 MG PO TABS: 650.0000 mg | ORAL_TABLET | ORAL | Status: AC | PRN

## 2017-07-15 MED ORDER — LEVOFLOXACIN 750 MG PO TABS: 750.0000 mg | ORAL_TABLET | Freq: Every day | ORAL | Status: DC

## 2017-07-15 MED ORDER — IPRATROPIUM-ALBUTEROL 0.5-2.5 (3) MG/3ML IN SOLN
3.0000 mL | Freq: Four times a day (QID) | RESPIRATORY_TRACT | Status: DC
  Administered 2017-07-15: 14:00:00 3 mL via RESPIRATORY_TRACT
  Filled 2017-07-15: qty 3

## 2017-07-15 MED ORDER — LEVOFLOXACIN 750 MG PO TABS
750.0000 mg | ORAL_TABLET | Freq: Every day | ORAL | Status: DC
  Administered 2017-07-15: 750 mg via ORAL
  Filled 2017-07-15: qty 1

## 2017-07-15 MED ORDER — LEVOFLOXACIN 750 MG PO TABS: 750.0000 mg | ORAL_TABLET | Freq: Every day | ORAL | 0 refills | Status: AC

## 2017-07-15 MED ORDER — IPRATROPIUM-ALBUTEROL 0.5-2.5 (3) MG/3ML IN SOLN: 3.0000 mL | Freq: Four times a day (QID) | RESPIRATORY_TRACT | 0 refills | Status: AC

## 2017-07-15 NOTE — Clinical Social Work Placement (Signed)
   CLINICAL SOCIAL WORK PLACEMENT  NOTE  Date:  07/15/2017  Patient Details  Name: Matthew Andersen MRN: [id number] Date of Birth: 07/23/1931  Clinical Social Work is seeking post-discharge placement for this patient at the [place] level of care (*CSW will initial, date and re-position this form in  chart as items are completed):  Yes   Patient/family provided with [facility] Work Department's list of facilities offering this level of care within the geographic area requested by the patient (or if unable, by the patient's family).  Yes   Patient/family informed of their freedom to choose among providers that offer the needed level of care, that participate in Medicare, Medicaid or managed care program needed by the patient, have an available bed and are willing to accept the patient.  Yes   Patient/family informed of [facility]'s ownership interest in [facility] and [facility], as well as of the fact that they are under no obligation to receive care at these facilities.  PASRR submitted to EDS on 07/14/17     PASRR number received on 07/14/17     Existing PASRR number confirmed on       FL2 transmitted to all facilities in geographic area requested by pt/family on 07/14/17     FL2 transmitted to all facilities within larger geographic area on       Patient informed that his/her managed care company has contracts with or will negotiate with certain facilities, including the following:  [facility]     Yes   Patient/family informed of bed offers received.  Patient chooses bed at [facility]     Physician recommends and patient chooses bed at [facility])    Patient to be transferred to [facility] on 07/15/17.  Patient to be transferred to facility by EMS     Patient family notified on 07/15/17 of transfer.  Name of family member notified:        PHYSICIAN       Additional Comment:     _______________________________________________ Annamaria Boots, [place] 07/15/2017, 3:17 PM

## 2017-07-15 NOTE — Discharge Summary (Signed)
Matthew Andersen NAME: Matthew Andersen    MR#:  [id number]  DATE OF BIRTH:  Jul 23, 1931  DATE OF ADMISSION:  07/12/2017 ADMITTING PHYSICIAN: Amelia Jo, MD  DATE OF DISCHARGE: 07/15/2017  PRIMARY CARE PHYSICIAN: Dr Emily Filbert   ADMISSION DIAGNOSIS:  Dehydration [E86.0] Altered mental status, unspecified altered mental status type [R41.82]  DISCHARGE DIAGNOSIS:  Principal Problem:   Mild dementia Active Problems:   Acute encephalopathy   Pressure injury of skin   SECONDARY DIAGNOSIS:   Past Medical History:  Diagnosis Date  . Cancer [facility])    Prostate  . Hypertension     HOSPITAL COURSE:   1.  Hypertensive encephalopathy.  Patient noncompliant with his blood pressure medications at home.  We restarted his normal Norvasc benazepril.  His blood pressure trended better.  MRI of the brain negative for stroke.  I am concerned about the patient living alone and taking care of himself at home.  He does have some cognitive decline.  He is able to answer questions. 2.  Carotid stenosis on CT angiogram of the neck.   stenosis needs to be followed as outpatient.  50 to 60% stenosis on the right carotid artery. 3.  Acute bronchitis.  Start p.o. Levaquin and DuoNeb nebulizer solution.  Patient on Pulmicort inhaler. 4.  Hyperlipidemia unspecified on pravastatin 5.  History of prostate cancer  6.  Unsteady gait.  Physical therapy recommended rehab. 7.  Cognitive decline.  since the patient lives alone I am concerned about him going home and his safety at home.  His wife is already at rehab at [facility]. 8.  Stage I decubitus ulcer on buttock.  Stage I decubitus ulcer on second left toe.    DISCHARGE CONDITIONS:   Fair  CONSULTS OBTAINED:  Treatment Team:  Leotis Pain, MD Clapacs, Madie Reno, MD  DRUG ALLERGIES:   Allergies  Allergen Reactions  . Cefzil [Cefprozil]   . Diphenhydramine   . Pseudoephedrine      DISCHARGE MEDICATIONS:   Allergies as of 07/15/2017      Reactions   Cefzil [cefprozil]    Diphenhydramine    Pseudoephedrine       Medication List    TAKE these medications   acetaminophen 325 MG tablet Commonly known as:  TYLENOL Take 2 tablets (650 mg total) by mouth every 4 (four) hours as needed for mild pain (or temp > 37.5 C (99.5 F)).   amLODipine-benazepril 5-20 MG capsule Commonly known as:  LOTREL Take 1 capsule by mouth daily.   aspirin EC 81 MG tablet Take 81 mg by mouth daily.   budesonide 180 MCG/ACT inhaler Commonly known as:  PULMICORT Inhale 1 puff into the lungs 2 (two) times daily.   guaiFENesin-dextromethorphan 100-10 MG/5ML syrup Commonly known as:  ROBITUSSIN DM Take 5 mLs by mouth every 4 (four) hours as needed for cough (chest congestion).   ipratropium-albuterol 0.5-2.5 (3) MG/3ML Soln Commonly known as:  DUONEB Take 3 mLs by nebulization every 6 (six) hours.   levofloxacin 750 MG tablet Commonly known as:  LEVAQUIN Take 1 tablet (750 mg total) by mouth daily for 4 days. Start taking on:  07/16/2017   pravastatin 20 MG tablet Commonly known as:  PRAVACHOL Take 1 tablet (20 mg total) by mouth daily at 6 PM. What changed:    medication strength  See the new instructions.        DISCHARGE INSTRUCTIONS:   Follow-up with Dr.  rehab 1 day  If you experience worsening of your admission symptoms, develop shortness of breath, life threatening emergency, suicidal or homicidal thoughts you must seek medical attention immediately by calling 911 or calling your MD immediately  if symptoms less severe.  You Must read complete instructions/literature along with all the possible adverse reactions/side effects for all the Medicines you take and that have been prescribed to you. Take any new Medicines after you have completely understood and accept all the possible adverse reactions/side effects.   Please note  You were cared for by a  hospitalist during your hospital stay. If you have any questions about your discharge medications or the care you received while you were in the hospital after you are discharged, you can call the unit and asked to speak with the hospitalist on call if the hospitalist that took care of you is not available. Once you are discharged, your primary care physician will handle any further medical issues. Please note that NO REFILLS for any discharge medications will be authorized once you are discharged, as it is imperative that you return to your primary care physician (or establish a relationship with a primary care physician if you do not have one) for your aftercare needs so that they can reassess your need for medications and monitor your lab values.    Today   CHIEF COMPLAINT:   Chief Complaint  Patient presents with  . Altered Mental Status    HISTORY OF PRESENT ILLNESS:  Matthew Andersen  is a 84 y.o. male with a known history of hypertension presents with altered mental status and severely high blood pressure   VITAL SIGNS:  Blood pressure (!) 138/56, pulse 65, temperature 98.1 F (36.7 C), temperature source Oral, resp. rate 18, height 6' (1.829 m), weight 81.6 kg (180 lb), SpO2 97 %.    PHYSICAL EXAMINATION:  GENERAL:  84 y.o.-year-old patient lying in the bed with no acute distress.  EYES: Pupils equal, round, reactive to light and accommodation. No scleral icterus. Extraocular muscles intact.  HEENT: Head atraumatic, normocephalic. Oropharynx and nasopharynx clear.  NECK:  Supple, no jugular venous distention. No thyroid enlargement, no tenderness.  LUNGS: Normal breath sounds bilaterally, no wheezing, rales,rhonchi or crepitation. No use of accessory muscles of respiration.  CARDIOVASCULAR: S1, S2 normal. No murmurs, rubs, or gallops.  ABDOMEN: Soft, non-tender, non-distended. Bowel sounds present. No organomegaly or mass.  EXTREMITIES: No pedal edema, cyanosis, or clubbing.   NEUROLOGIC: Cranial nerves II through XII are intact. Muscle strength 5/5 in all extremities. Sensation intact. Gait not checked.  PSYCHIATRIC: The patient is alert and   Answers questions. SKIN: No obvious rash, lesion, or ulcer.   DATA REVIEW:   CBC Recent Labs  Lab 07/12/17 2131  WBC 6.5  HGB 13.9  HCT 41.6  PLT 255    Chemistries  Recent Labs  Lab 07/12/17 2131  NA 139  K 3.8  CL 108  CO2 21*  GLUCOSE 107*  BUN 33*  CREATININE 1.03  CALCIUM 9.1  AST 37  ALT 15*  ALKPHOS 57  BILITOT 0.7    Cardiac Enzymes Recent Labs  Lab 07/12/17 2131  TROPONINI <0.03      RADIOLOGY:  Dg Pelvis 1-2 Views  Result Date: 07/13/2017 CLINICAL DATA:  MRI clearance. EXAM: PELVIS - 1-2 VIEW COMPARISON:  None. FINDINGS: Exam demonstrates a right total hip arthroplasty intact and normally located. There are degenerative changes of the spine and left hip. IMPRESSION: No acute findings. Right total  hip arthroplasty intact. Electronically Signed   By: Marin Olp M.D.   On: 07/13/2017 09:49   Dg Abd 1 View  Result Date: 07/13/2017 CLINICAL DATA:  MRI clearance. EXAM: ABDOMEN - 1 VIEW COMPARISON:  None. FINDINGS: Examination demonstrates a nonobstructive bowel gas pattern with mild fecal retention throughout the colon. Moderate degenerate change of the spine. Mild degenerate change of the left hip. Right total hip arthroplasty is present. IMPRESSION: Nonobstructive bowel gas pattern with mild fecal retention throughout the colon. Partially visualized right hip arthroplasty intact. Electronically Signed   By: Marin Olp M.D.   On: 07/13/2017 09:49   Ct Angio Neck W Or Wo Contrast  Result Date: 07/13/2017 CLINICAL DATA:  84 y/o M; altered mental status, encephalopathy, confusion. Carotid stenosis for follow-up. EXAM: CT ANGIOGRAPHY NECK TECHNIQUE: Multidetector CT imaging of the neck was performed using the standard protocol during bolus administration of intravenous contrast.  Multiplanar CT image reconstructions and MIPs were obtained to evaluate the vascular anatomy. Carotid stenosis measurements (when applicable) are obtained utilizing NASCET criteria, using the distal internal carotid diameter as the denominator. CONTRAST:  68mL OMNIPAQUE IOHEXOL 350 MG/ML SOLN COMPARISON:  07/13/2017 MRI head, carotid ultrasound. FINDINGS: Aortic arch: Standard branching. Imaged portion shows no evidence of aneurysm or dissection. No significant stenosis of the major arch vessel origins. Mild calcific atherosclerosis. Right carotid system: Calcified plaque of the right carotid bifurcation with moderate 50-60% proximal ICA stenosis (series 11, image 7). No dissection or aneurysm. Left carotid system: No evidence of dissection, stenosis (50% or greater) or occlusion. Mild calcific atherosclerosis of left carotid bifurcation without significant stenosis. Vertebral arteries: Right dominant. No evidence of dissection, stenosis (50% or greater) or occlusion. Skeleton: Cervical spondylosis with advanced multilevel facet arthropathy. No high-grade bony canal stenosis. No loss of vertebral body height. Other neck: Multiple thyroid nodules measuring up to 17 mm in the right lobe. Upper chest: Negative. IMPRESSION: 1. Right proximal ICA moderate 50-60% stenosis with calcified plaque. 2. Calcific atherosclerosis of aorta and left carotid bifurcation without significant stenosis. 3. Multiple thyroid nodules measuring up to 17 mm. Thyroid ultrasound is recommended on a nonemergent basis. Electronically Signed   By: Kristine Garbe M.D.   On: 07/13/2017 14:06   Mr Brain Wo Contrast  Result Date: 07/13/2017 CLINICAL DATA:  Altered mental status. EXAM: MRI HEAD WITHOUT CONTRAST TECHNIQUE: Multiplanar, multiecho pulse sequences of the brain and surrounding structures were obtained without intravenous contrast. COMPARISON:  Head CT 07/12/2017 FINDINGS: Brain: There is no evidence of acute infarct,  intracranial hemorrhage, mass, midline shift, or extra-axial fluid collection. Generalized cerebral atrophy is mild-to-moderate for age. Scattered cerebral white matter T2 hyperintensities are nonspecific but compatible with chronic small vessel ischemic disease, minimal for age. A chronic lacunar infarct is noted in the left thalamus. Vascular: Major intracranial vascular flow voids are preserved. Skull and upper cervical spine: No suspicious marrow lesion. Sinuses/Orbits: Bilateral cataract extraction. Mild bilateral ethmoid air cell mucosal thickening. Small right maxillary sinus mucous retention cyst. Clear mastoid air cells. Other: None. IMPRESSION: 1. No acute intracranial abnormality. 2. Mild-to-moderate cerebral atrophy and minimal chronic small vessel ischemic disease. Electronically Signed   By: Logan Bores M.D.   On: 07/13/2017 11:29     Management plans discussed with the patient, and he is in agreement. Left message for son.  CODE STATUS:     Code Status Orders  (From admission, onward)        Start     Ordered   07/13/17  2902  Full code  Continuous     07/13/17 0055    Code Status History    This patient has a current code status but no historical code status.    Advance Directive Documentation     Most Recent Value  Type of Advance Directive  Healthcare Power of Attorney  Pre-existing out of facility DNR order (yellow form or pink MOST form)  -  "MOST" Form in Place?  -      TOTAL TIME TAKING CARE OF THIS PATIENT: 35 minutes.    Loletha Grayer M.D on 07/15/2017 at 8:51 AM  Between 7am to 6pm - Pager - [phone]  After 6pm go to [email or web address] - Proofreader  [employer] Office  [phone]  CC: Primary care physician; Katheren Shams

## 2017-07-15 NOTE — Care Management Important Message (Signed)
Important Message  Patient Details  Name: Matthew Andersen MRN: [id number] Date of Birth: July 21, 1931   Medicare Important Message Given:  Yes    [name] 07/15/2017, 9:44 AM

## 2017-07-15 NOTE — Progress Notes (Signed)
Report called to [facility], all belongings packed and returned to the patient. 1 IV removed. EMS was notified of the need for transport.

## 2017-07-15 NOTE — Clinical Social Work Note (Signed)
Patient is medically ready to discharge to [facility] today. CSW notified patient of discharge. Patient is in agreement to go to [facility]. CSW notified Neoma Laming at [facility] of discharge. CSW attempted to contact patient's son Tyrek Lawhorn [phone] four times with no success. CSW left message for son regarding discharge. RN will call report and call for transport.   [place], [place]

## 2019-11-09 IMAGING — MR MR HEAD W/O CM
10 series · 44 of 48 positions shown · non-contrast
Comparison: Head CT 07/12/2017

CLINICAL DATA: Altered mental status.

EXAM:
MRI HEAD WITHOUT CONTRAST
TECHNIQUE: Multiplanar, multiecho pulse sequences of the brain and surrounding
structures were obtained without intravenous contrast.

[Series 2: GRE · sagittal · 5.0mm · 0.45mm/px · 3 of 27 slices shown (1 of 2)]
[im 1/27]
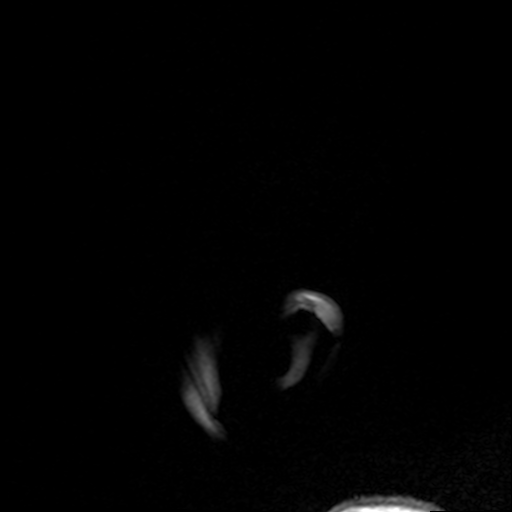
[im 14/27]
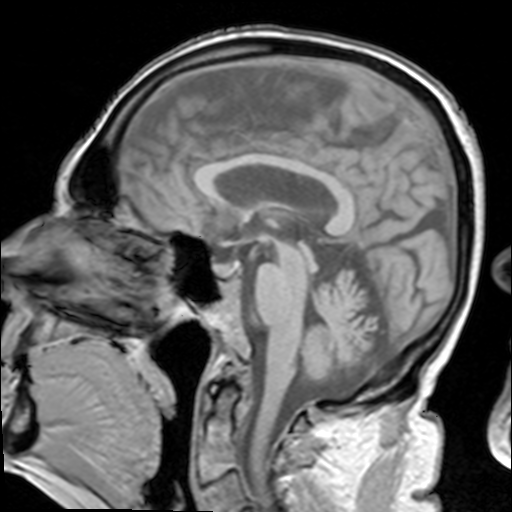
[im 27/27]
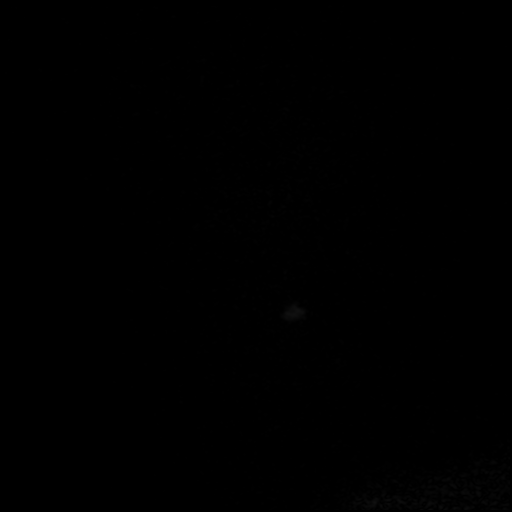

[Series 4: DWI · axial · 3.0mm · 1.80mm/px · z∈[-36,+126]mm · 7 of 57 slices shown (1 of 2)]
[im 1/57]
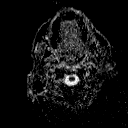
[im 10/57]
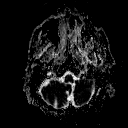
[im 19/57]
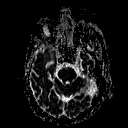
[im 29/57]
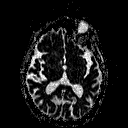
[im 38/57]
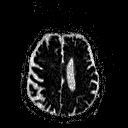
[im 47/57]
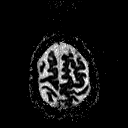
[im 57/57]
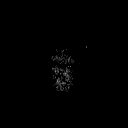

[Series 6: DWI · coronal · 3.0mm · 1.80mm/px · 6 of 49 slices shown (2 of 2)]
[im 1/49]
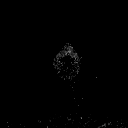
[im 10/49]
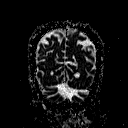
[im 20/49]
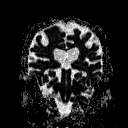
[im 29/49]
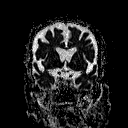
[im 39/49]
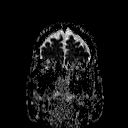
[im 49/49]
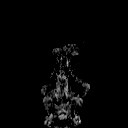

[Series 7: T2 · axial · 5.0mm · 0.45mm/px · z∈[-38,+124]mm · 3 of 27 slices shown (1 of 3)]
[im 1/27]
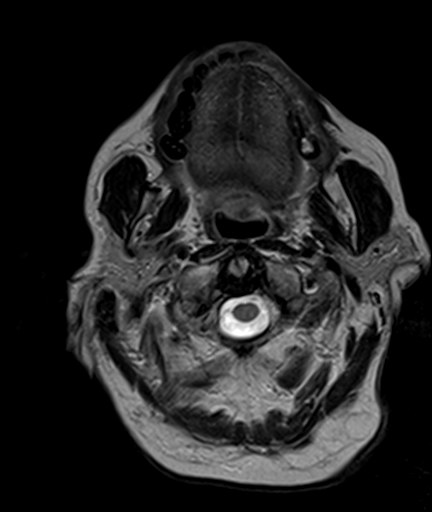
[im 14/27]
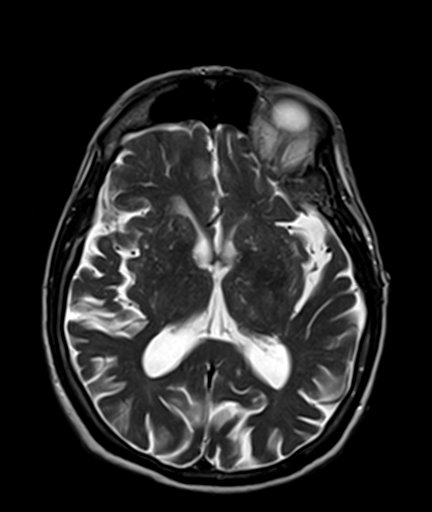
[im 27/27]
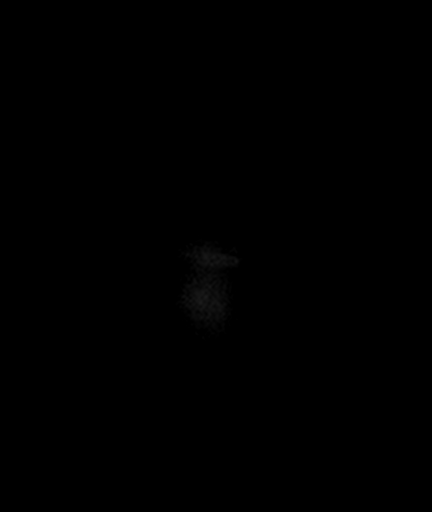

[Series 8: FLAIR · axial · 3.0mm · 0.45mm/px · z∈[-34,+122]mm · 7 of 55 slices shown]
[im 1/55]
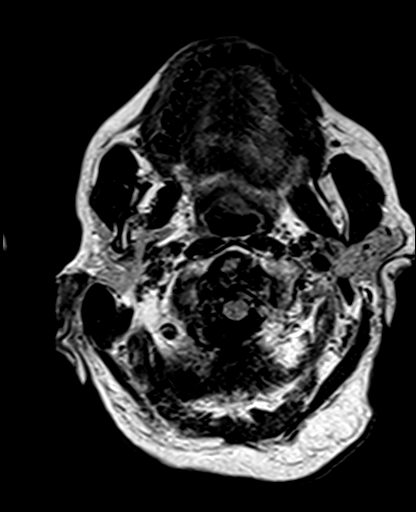
[im 10/55]
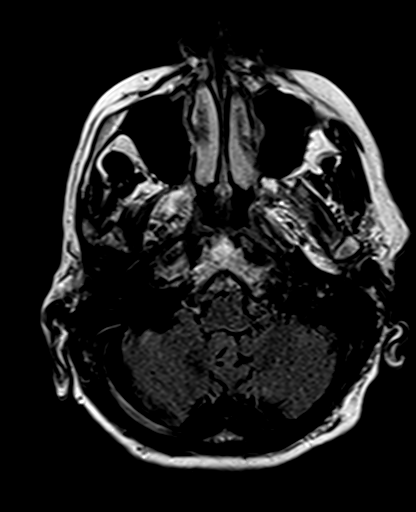
[im 19/55]
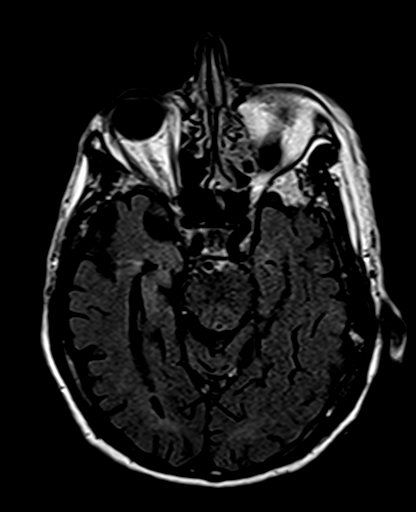
[im 28/55]
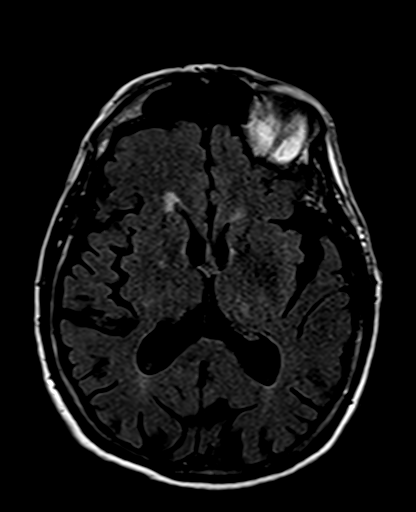
[im 37/55]
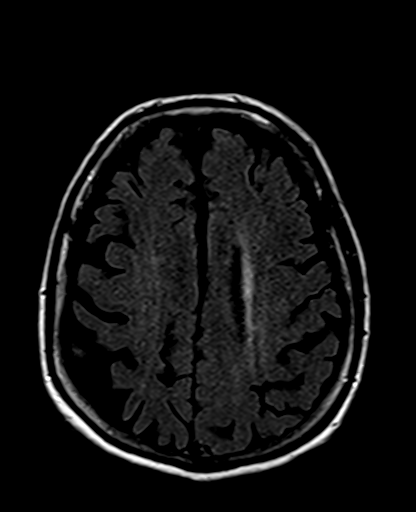
[im 46/55]
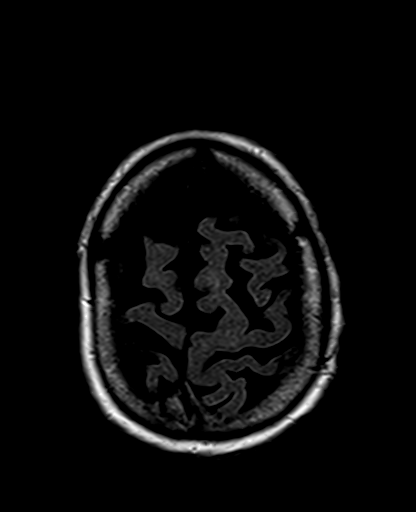
[im 55/55]
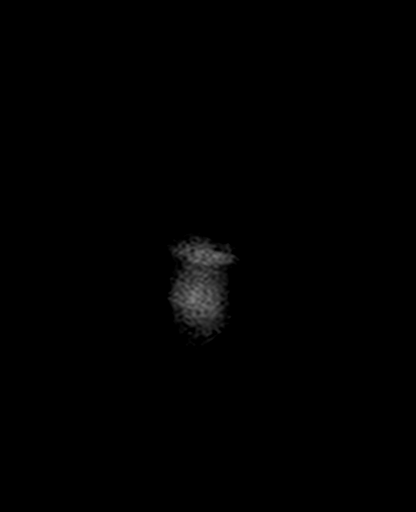

[Series 9: T2 · axial · 5.0mm · 1.20mm/px · z∈[-34,+128]mm · 3 of 27 slices shown (2 of 3)]
[im 1/27]
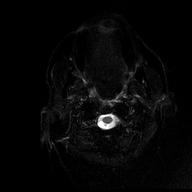
[im 14/27]
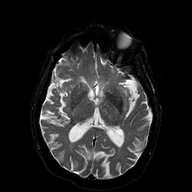
[im 27/27]
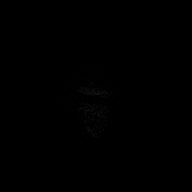

[Series 10: GRE · axial · 5.0mm · 0.45mm/px · z∈[-28,+122]mm · 3 of 25 slices shown (2 of 2)]
[im 1/25]
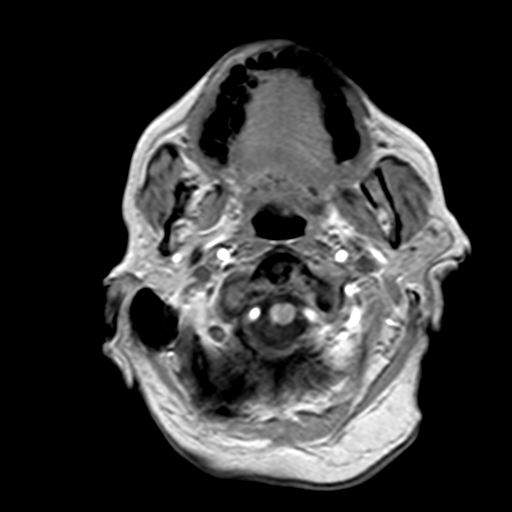
[im 13/25]
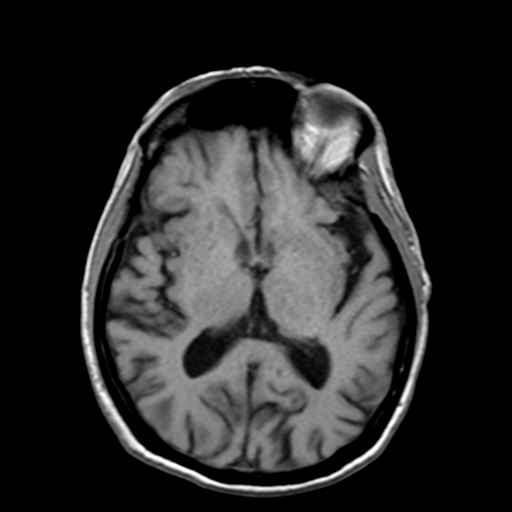
[im 25/25]
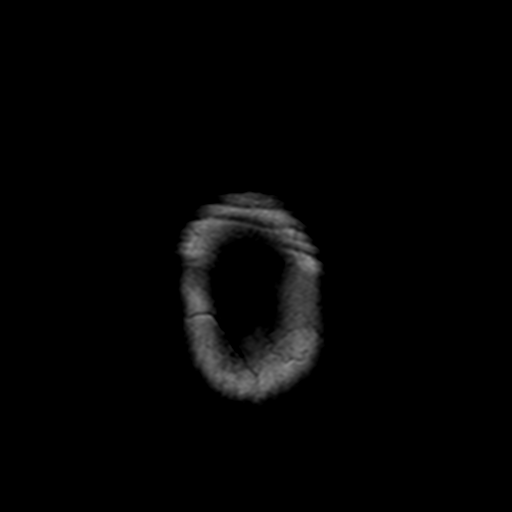

[Series 11: T2 · coronal · 5.0mm · 0.43mm/px · 3 of 29 slices shown (3 of 3)]
[im 1/29]
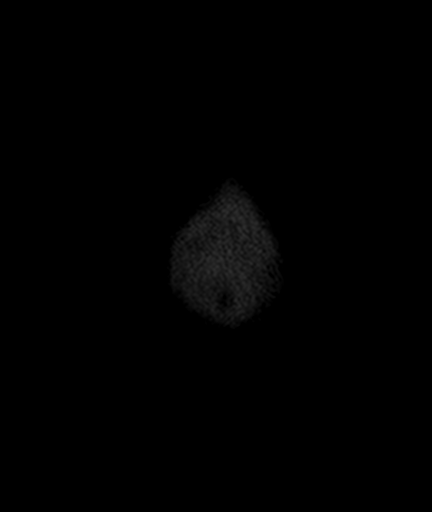
[im 15/29]
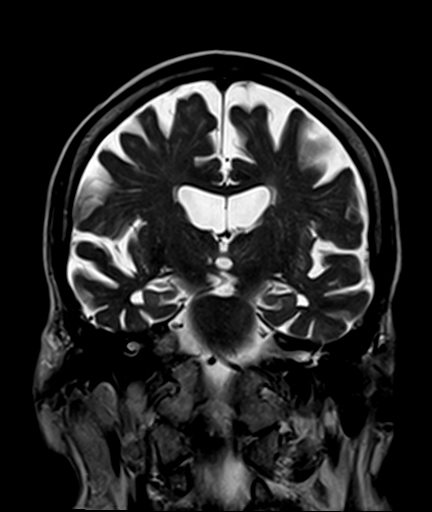
[im 29/29]
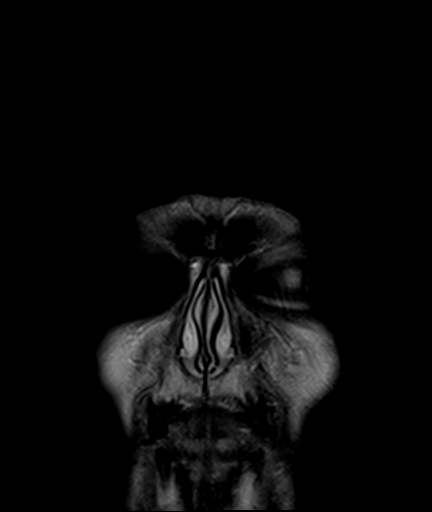

[Series 100: ax (id) · axial · 3.0mm · 1.80mm/px · z∈[-36,+126]mm · 7 of 57 slices shown]
[im 1/57]
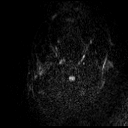
[im 10/57]
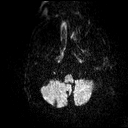
[im 19/57]
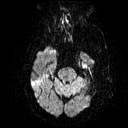
[im 29/57]
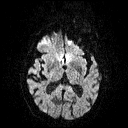
[im 38/57]
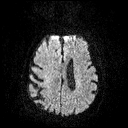
[im 47/57]
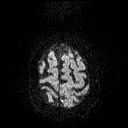
[im 57/57]
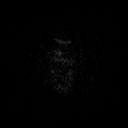

[Series 101: cor (id) · coronal · 3.0mm · 1.80mm/px · 2 of 49 slices shown]
[im 1/49]
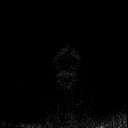
[im 10/49]
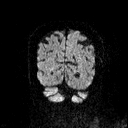

[44 of 48 positions shown; findings below may reference images not displayed]

FINDINGS: Brain: There is no evidence of acute infarct, intracranial
hemorrhage, mass, midline shift, or extra-axial fluid collection.
Generalized cerebral atrophy is mild-to-moderate for age. Scattered
cerebral white matter T2 hyperintensities are nonspecific but
compatible with chronic small vessel ischemic disease, minimal for
age. A chronic lacunar infarct is noted in the left thalamus.

Vascular: Major intracranial vascular flow voids are preserved.

Skull and upper cervical spine: No suspicious marrow lesion.

Sinuses/Orbits: Bilateral cataract extraction. Mild bilateral
ethmoid air cell mucosal thickening. Small right maxillary sinus
mucous retention cyst. Clear mastoid air cells.

Other: None.
IMPRESSION: 1. No acute intracranial abnormality.
2. Mild-to-moderate cerebral atrophy and minimal chronic small
vessel ischemic disease.

## 2019-11-09 IMAGING — CR DG ABDOMEN 1V
1 series · 1 of 1 positions shown · non-contrast
Comparison: None.

CLINICAL DATA: MRI clearance.

EXAM:
ABDOMEN - 1 VIEW

[abdomen kub]
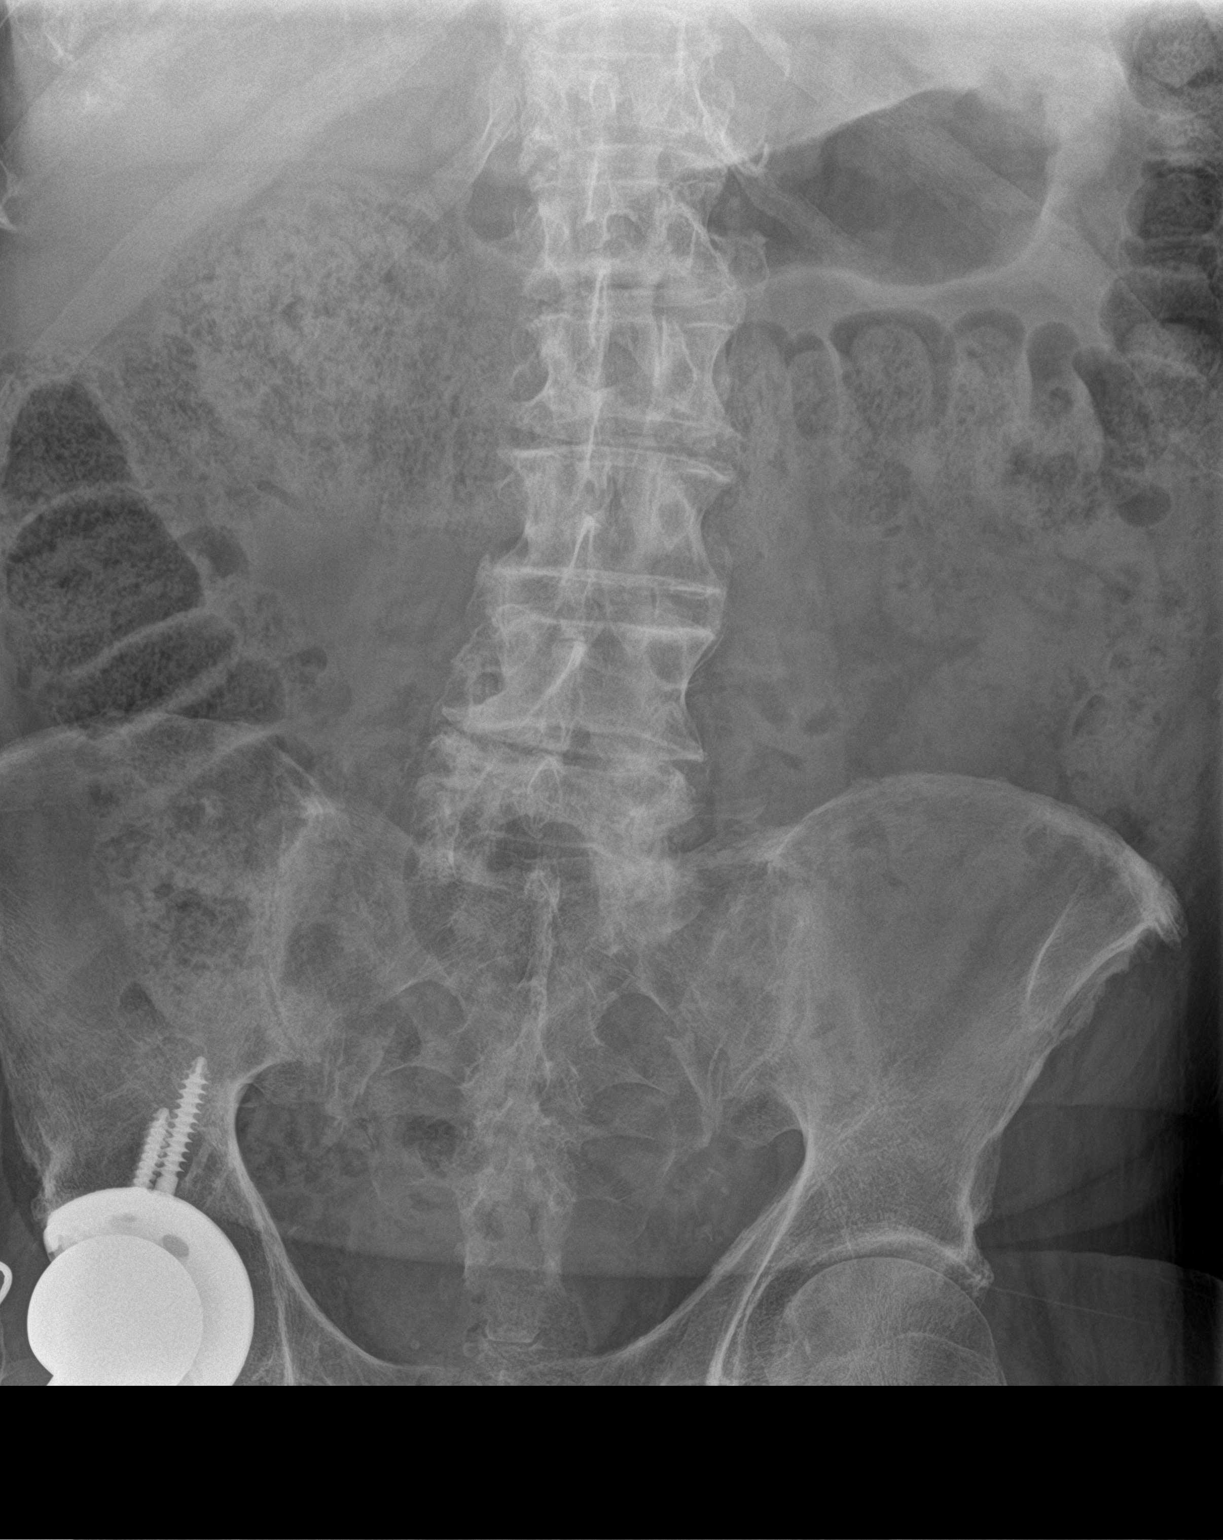

[1 of 1 positions shown; findings below may reference images not displayed]

FINDINGS: Examination demonstrates a nonobstructive bowel gas pattern with
mild fecal retention throughout the colon. Moderate degenerate
change of the spine. Mild degenerate change of the left hip. Right
total hip arthroplasty is present..
IMPRESSION: Nonobstructive bowel gas pattern with mild fecal retention
throughout the colon.

Partially visualized right hip arthroplasty intact.

## 2020-02-09 DIAGNOSIS — G301 Alzheimer's disease with late onset: Secondary | ICD-10-CM | POA: Diagnosis not present

## 2020-02-09 DIAGNOSIS — F0281 Dementia in other diseases classified elsewhere with behavioral disturbance: Secondary | ICD-10-CM | POA: Diagnosis not present

## 2020-02-17 DIAGNOSIS — R2681 Unsteadiness on feet: Secondary | ICD-10-CM | POA: Diagnosis not present

## 2020-02-17 DIAGNOSIS — K219 Gastro-esophageal reflux disease without esophagitis: Secondary | ICD-10-CM | POA: Diagnosis not present

## 2020-02-17 DIAGNOSIS — F0390 Unspecified dementia without behavioral disturbance: Secondary | ICD-10-CM | POA: Diagnosis not present

## 2020-02-17 DIAGNOSIS — E782 Mixed hyperlipidemia: Secondary | ICD-10-CM | POA: Diagnosis not present

## 2020-02-17 DIAGNOSIS — I10 Essential (primary) hypertension: Secondary | ICD-10-CM | POA: Diagnosis not present

## 2020-02-25 DIAGNOSIS — Z03818 Encounter for observation for suspected exposure to other biological agents ruled out: Secondary | ICD-10-CM | POA: Diagnosis not present

## 2020-02-26 DIAGNOSIS — R058 Other specified cough: Secondary | ICD-10-CM | POA: Diagnosis not present

## 2020-03-05 DIAGNOSIS — Z79899 Other long term (current) drug therapy: Secondary | ICD-10-CM | POA: Diagnosis not present

## 2020-03-08 DIAGNOSIS — G301 Alzheimer's disease with late onset: Secondary | ICD-10-CM | POA: Diagnosis not present

## 2020-03-08 DIAGNOSIS — F32A Depression, unspecified: Secondary | ICD-10-CM | POA: Diagnosis not present

## 2020-03-08 DIAGNOSIS — F0281 Dementia in other diseases classified elsewhere with behavioral disturbance: Secondary | ICD-10-CM | POA: Diagnosis not present

## 2020-04-07 DIAGNOSIS — Z79899 Other long term (current) drug therapy: Secondary | ICD-10-CM | POA: Diagnosis not present

## 2020-04-19 DIAGNOSIS — F0280 Dementia in other diseases classified elsewhere without behavioral disturbance: Secondary | ICD-10-CM | POA: Diagnosis not present

## 2020-04-19 DIAGNOSIS — G301 Alzheimer's disease with late onset: Secondary | ICD-10-CM | POA: Diagnosis not present

## 2020-08-16 DIAGNOSIS — E782 Mixed hyperlipidemia: Secondary | ICD-10-CM | POA: Diagnosis not present

## 2020-08-16 DIAGNOSIS — F0390 Unspecified dementia without behavioral disturbance: Secondary | ICD-10-CM | POA: Diagnosis not present

## 2020-08-16 DIAGNOSIS — R2681 Unsteadiness on feet: Secondary | ICD-10-CM | POA: Diagnosis not present

## 2020-08-22 DIAGNOSIS — Z79899 Other long term (current) drug therapy: Secondary | ICD-10-CM | POA: Diagnosis not present

## 2020-09-01 DIAGNOSIS — I10 Essential (primary) hypertension: Secondary | ICD-10-CM | POA: Diagnosis not present

## 2020-09-01 DIAGNOSIS — F0390 Unspecified dementia without behavioral disturbance: Secondary | ICD-10-CM | POA: Diagnosis not present

## 2020-09-01 DIAGNOSIS — D519 Vitamin B12 deficiency anemia, unspecified: Secondary | ICD-10-CM | POA: Diagnosis not present

## 2020-09-01 DIAGNOSIS — K219 Gastro-esophageal reflux disease without esophagitis: Secondary | ICD-10-CM | POA: Diagnosis not present

## 2020-09-24 DIAGNOSIS — Z79899 Other long term (current) drug therapy: Secondary | ICD-10-CM | POA: Diagnosis not present

## 2020-10-24 DIAGNOSIS — D519 Vitamin B12 deficiency anemia, unspecified: Secondary | ICD-10-CM | POA: Diagnosis not present

## 2020-10-24 DIAGNOSIS — F0390 Unspecified dementia without behavioral disturbance: Secondary | ICD-10-CM | POA: Diagnosis not present

## 2020-10-24 DIAGNOSIS — I10 Essential (primary) hypertension: Secondary | ICD-10-CM | POA: Diagnosis not present

## 2020-10-24 DIAGNOSIS — K219 Gastro-esophageal reflux disease without esophagitis: Secondary | ICD-10-CM | POA: Diagnosis not present

## 2020-11-16 DIAGNOSIS — Z23 Encounter for immunization: Secondary | ICD-10-CM | POA: Diagnosis not present

## 2021-01-04 DIAGNOSIS — M6281 Muscle weakness (generalized): Secondary | ICD-10-CM | POA: Diagnosis not present

## 2021-01-04 DIAGNOSIS — R2681 Unsteadiness on feet: Secondary | ICD-10-CM | POA: Diagnosis not present

## 2021-01-04 DIAGNOSIS — R278 Other lack of coordination: Secondary | ICD-10-CM | POA: Diagnosis not present

## 2021-01-11 DIAGNOSIS — I10 Essential (primary) hypertension: Secondary | ICD-10-CM | POA: Diagnosis not present

## 2021-01-11 DIAGNOSIS — R4189 Other symptoms and signs involving cognitive functions and awareness: Secondary | ICD-10-CM | POA: Diagnosis not present

## 2021-01-11 DIAGNOSIS — E785 Hyperlipidemia, unspecified: Secondary | ICD-10-CM | POA: Diagnosis not present

## 2021-04-20 DIAGNOSIS — I10 Essential (primary) hypertension: Secondary | ICD-10-CM | POA: Diagnosis not present

## 2021-04-20 DIAGNOSIS — C61 Malignant neoplasm of prostate: Secondary | ICD-10-CM | POA: Diagnosis not present

## 2021-04-20 DIAGNOSIS — D519 Vitamin B12 deficiency anemia, unspecified: Secondary | ICD-10-CM | POA: Diagnosis not present

## 2021-04-20 DIAGNOSIS — K219 Gastro-esophageal reflux disease without esophagitis: Secondary | ICD-10-CM | POA: Diagnosis not present

## 2021-04-20 DIAGNOSIS — E785 Hyperlipidemia, unspecified: Secondary | ICD-10-CM | POA: Diagnosis not present

## 2021-04-20 DIAGNOSIS — F0390 Unspecified dementia without behavioral disturbance: Secondary | ICD-10-CM | POA: Diagnosis not present

## 2021-05-22 DIAGNOSIS — Z79899 Other long term (current) drug therapy: Secondary | ICD-10-CM | POA: Diagnosis not present

## 2021-05-22 DIAGNOSIS — G309 Alzheimer's disease, unspecified: Secondary | ICD-10-CM | POA: Diagnosis not present

## 2021-05-22 DIAGNOSIS — E785 Hyperlipidemia, unspecified: Secondary | ICD-10-CM | POA: Diagnosis not present

## 2021-05-22 DIAGNOSIS — C61 Malignant neoplasm of prostate: Secondary | ICD-10-CM | POA: Diagnosis not present

## 2021-05-24 DIAGNOSIS — Z79899 Other long term (current) drug therapy: Secondary | ICD-10-CM | POA: Diagnosis not present

## 2021-05-24 DIAGNOSIS — I509 Heart failure, unspecified: Secondary | ICD-10-CM | POA: Diagnosis not present

## 2021-05-26 DIAGNOSIS — Z79899 Other long term (current) drug therapy: Secondary | ICD-10-CM | POA: Diagnosis not present

## 2021-05-31 DIAGNOSIS — E119 Type 2 diabetes mellitus without complications: Secondary | ICD-10-CM | POA: Diagnosis not present

## 2021-06-13 DIAGNOSIS — I10 Essential (primary) hypertension: Secondary | ICD-10-CM | POA: Diagnosis not present

## 2021-06-13 DIAGNOSIS — K219 Gastro-esophageal reflux disease without esophagitis: Secondary | ICD-10-CM | POA: Diagnosis not present

## 2021-06-13 DIAGNOSIS — E785 Hyperlipidemia, unspecified: Secondary | ICD-10-CM | POA: Diagnosis not present

## 2021-06-13 DIAGNOSIS — C61 Malignant neoplasm of prostate: Secondary | ICD-10-CM | POA: Diagnosis not present

## 2021-06-13 DIAGNOSIS — D519 Vitamin B12 deficiency anemia, unspecified: Secondary | ICD-10-CM | POA: Diagnosis not present

## 2021-06-13 DIAGNOSIS — F0390 Unspecified dementia without behavioral disturbance: Secondary | ICD-10-CM | POA: Diagnosis not present

## 2021-06-29 DIAGNOSIS — K219 Gastro-esophageal reflux disease without esophagitis: Secondary | ICD-10-CM | POA: Diagnosis not present

## 2021-06-29 DIAGNOSIS — E785 Hyperlipidemia, unspecified: Secondary | ICD-10-CM | POA: Diagnosis not present

## 2021-06-29 DIAGNOSIS — D519 Vitamin B12 deficiency anemia, unspecified: Secondary | ICD-10-CM | POA: Diagnosis not present

## 2021-06-29 DIAGNOSIS — Z79899 Other long term (current) drug therapy: Secondary | ICD-10-CM | POA: Diagnosis not present

## 2021-06-29 DIAGNOSIS — F0390 Unspecified dementia without behavioral disturbance: Secondary | ICD-10-CM | POA: Diagnosis not present

## 2021-07-01 DIAGNOSIS — I10 Essential (primary) hypertension: Secondary | ICD-10-CM | POA: Diagnosis not present

## 2021-08-08 DIAGNOSIS — E119 Type 2 diabetes mellitus without complications: Secondary | ICD-10-CM | POA: Diagnosis not present

## 2021-08-15 DIAGNOSIS — E785 Hyperlipidemia, unspecified: Secondary | ICD-10-CM | POA: Diagnosis not present

## 2021-08-15 DIAGNOSIS — D519 Vitamin B12 deficiency anemia, unspecified: Secondary | ICD-10-CM | POA: Diagnosis not present

## 2021-08-15 DIAGNOSIS — K219 Gastro-esophageal reflux disease without esophagitis: Secondary | ICD-10-CM | POA: Diagnosis not present

## 2021-08-15 DIAGNOSIS — F0390 Unspecified dementia without behavioral disturbance: Secondary | ICD-10-CM | POA: Diagnosis not present

## 2021-08-15 DIAGNOSIS — I10 Essential (primary) hypertension: Secondary | ICD-10-CM | POA: Diagnosis not present

## 2021-08-15 DIAGNOSIS — C61 Malignant neoplasm of prostate: Secondary | ICD-10-CM | POA: Diagnosis not present

## 2021-08-15 DIAGNOSIS — Z79899 Other long term (current) drug therapy: Secondary | ICD-10-CM | POA: Diagnosis not present

## 2021-08-22 DIAGNOSIS — D519 Vitamin B12 deficiency anemia, unspecified: Secondary | ICD-10-CM | POA: Diagnosis not present

## 2021-08-22 DIAGNOSIS — K219 Gastro-esophageal reflux disease without esophagitis: Secondary | ICD-10-CM | POA: Diagnosis not present

## 2021-08-22 DIAGNOSIS — I10 Essential (primary) hypertension: Secondary | ICD-10-CM | POA: Diagnosis not present

## 2021-08-22 DIAGNOSIS — E785 Hyperlipidemia, unspecified: Secondary | ICD-10-CM | POA: Diagnosis not present

## 2021-08-22 DIAGNOSIS — Z79899 Other long term (current) drug therapy: Secondary | ICD-10-CM | POA: Diagnosis not present

## 2021-08-22 DIAGNOSIS — F0390 Unspecified dementia without behavioral disturbance: Secondary | ICD-10-CM | POA: Diagnosis not present

## 2021-10-07 DIAGNOSIS — E119 Type 2 diabetes mellitus without complications: Secondary | ICD-10-CM | POA: Diagnosis not present

## 2021-10-13 DIAGNOSIS — E785 Hyperlipidemia, unspecified: Secondary | ICD-10-CM | POA: Diagnosis not present

## 2021-10-13 DIAGNOSIS — Z79899 Other long term (current) drug therapy: Secondary | ICD-10-CM | POA: Diagnosis not present

## 2021-10-13 DIAGNOSIS — F0390 Unspecified dementia without behavioral disturbance: Secondary | ICD-10-CM | POA: Diagnosis not present

## 2021-10-13 DIAGNOSIS — C61 Malignant neoplasm of prostate: Secondary | ICD-10-CM | POA: Diagnosis not present

## 2021-10-13 DIAGNOSIS — I10 Essential (primary) hypertension: Secondary | ICD-10-CM | POA: Diagnosis not present

## 2021-10-13 DIAGNOSIS — D519 Vitamin B12 deficiency anemia, unspecified: Secondary | ICD-10-CM | POA: Diagnosis not present

## 2021-10-13 DIAGNOSIS — G309 Alzheimer's disease, unspecified: Secondary | ICD-10-CM | POA: Diagnosis not present

## 2021-10-13 DIAGNOSIS — K219 Gastro-esophageal reflux disease without esophagitis: Secondary | ICD-10-CM | POA: Diagnosis not present

## 2021-10-18 DIAGNOSIS — K219 Gastro-esophageal reflux disease without esophagitis: Secondary | ICD-10-CM | POA: Diagnosis not present

## 2021-10-18 DIAGNOSIS — D519 Vitamin B12 deficiency anemia, unspecified: Secondary | ICD-10-CM | POA: Diagnosis not present

## 2021-10-18 DIAGNOSIS — I10 Essential (primary) hypertension: Secondary | ICD-10-CM | POA: Diagnosis not present

## 2021-10-18 DIAGNOSIS — E785 Hyperlipidemia, unspecified: Secondary | ICD-10-CM | POA: Diagnosis not present

## 2021-10-18 DIAGNOSIS — G309 Alzheimer's disease, unspecified: Secondary | ICD-10-CM | POA: Diagnosis not present

## 2021-10-18 DIAGNOSIS — F0280 Dementia in other diseases classified elsewhere without behavioral disturbance: Secondary | ICD-10-CM | POA: Diagnosis not present

## 2021-12-08 DIAGNOSIS — I10 Essential (primary) hypertension: Secondary | ICD-10-CM | POA: Diagnosis not present

## 2021-12-08 DIAGNOSIS — F0390 Unspecified dementia without behavioral disturbance: Secondary | ICD-10-CM | POA: Diagnosis not present

## 2021-12-08 DIAGNOSIS — K219 Gastro-esophageal reflux disease without esophagitis: Secondary | ICD-10-CM | POA: Diagnosis not present

## 2021-12-08 DIAGNOSIS — E785 Hyperlipidemia, unspecified: Secondary | ICD-10-CM | POA: Diagnosis not present

## 2021-12-28 DIAGNOSIS — K219 Gastro-esophageal reflux disease without esophagitis: Secondary | ICD-10-CM | POA: Diagnosis not present

## 2021-12-28 DIAGNOSIS — Z79899 Other long term (current) drug therapy: Secondary | ICD-10-CM | POA: Diagnosis not present

## 2021-12-28 DIAGNOSIS — D519 Vitamin B12 deficiency anemia, unspecified: Secondary | ICD-10-CM | POA: Diagnosis not present

## 2021-12-28 DIAGNOSIS — I10 Essential (primary) hypertension: Secondary | ICD-10-CM | POA: Diagnosis not present

## 2021-12-28 DIAGNOSIS — N189 Chronic kidney disease, unspecified: Secondary | ICD-10-CM | POA: Diagnosis not present

## 2021-12-28 DIAGNOSIS — E7800 Pure hypercholesterolemia, unspecified: Secondary | ICD-10-CM | POA: Diagnosis not present

## 2021-12-28 DIAGNOSIS — E782 Mixed hyperlipidemia: Secondary | ICD-10-CM | POA: Diagnosis not present

## 2022-01-02 DIAGNOSIS — E785 Hyperlipidemia, unspecified: Secondary | ICD-10-CM | POA: Diagnosis not present

## 2022-01-02 DIAGNOSIS — D508 Other iron deficiency anemias: Secondary | ICD-10-CM | POA: Diagnosis not present

## 2022-01-02 DIAGNOSIS — E119 Type 2 diabetes mellitus without complications: Secondary | ICD-10-CM | POA: Diagnosis not present

## 2022-01-02 DIAGNOSIS — C61 Malignant neoplasm of prostate: Secondary | ICD-10-CM | POA: Diagnosis not present

## 2022-01-03 DIAGNOSIS — N189 Chronic kidney disease, unspecified: Secondary | ICD-10-CM | POA: Diagnosis not present

## 2022-01-03 DIAGNOSIS — F0390 Unspecified dementia without behavioral disturbance: Secondary | ICD-10-CM | POA: Diagnosis not present

## 2022-01-03 DIAGNOSIS — R300 Dysuria: Secondary | ICD-10-CM | POA: Diagnosis not present

## 2022-01-03 DIAGNOSIS — Z79899 Other long term (current) drug therapy: Secondary | ICD-10-CM | POA: Diagnosis not present

## 2022-01-03 DIAGNOSIS — R4689 Other symptoms and signs involving appearance and behavior: Secondary | ICD-10-CM | POA: Diagnosis not present

## 2022-01-03 DIAGNOSIS — I10 Essential (primary) hypertension: Secondary | ICD-10-CM | POA: Diagnosis not present

## 2022-01-03 DIAGNOSIS — K219 Gastro-esophageal reflux disease without esophagitis: Secondary | ICD-10-CM | POA: Diagnosis not present

## 2022-01-05 DIAGNOSIS — E119 Type 2 diabetes mellitus without complications: Secondary | ICD-10-CM | POA: Diagnosis not present

## 2022-01-05 DIAGNOSIS — D508 Other iron deficiency anemias: Secondary | ICD-10-CM | POA: Diagnosis not present

## 2022-01-05 DIAGNOSIS — Z79899 Other long term (current) drug therapy: Secondary | ICD-10-CM | POA: Diagnosis not present

## 2022-01-05 DIAGNOSIS — E785 Hyperlipidemia, unspecified: Secondary | ICD-10-CM | POA: Diagnosis not present

## 2022-01-05 DIAGNOSIS — N390 Urinary tract infection, site not specified: Secondary | ICD-10-CM | POA: Diagnosis not present

## 2022-01-05 DIAGNOSIS — C61 Malignant neoplasm of prostate: Secondary | ICD-10-CM | POA: Diagnosis not present

## 2022-01-08 DIAGNOSIS — N189 Chronic kidney disease, unspecified: Secondary | ICD-10-CM | POA: Diagnosis not present

## 2022-01-08 DIAGNOSIS — N390 Urinary tract infection, site not specified: Secondary | ICD-10-CM | POA: Diagnosis not present

## 2022-01-08 DIAGNOSIS — R300 Dysuria: Secondary | ICD-10-CM | POA: Diagnosis not present

## 2022-01-08 DIAGNOSIS — I10 Essential (primary) hypertension: Secondary | ICD-10-CM | POA: Diagnosis not present

## 2022-01-08 DIAGNOSIS — Z79899 Other long term (current) drug therapy: Secondary | ICD-10-CM | POA: Diagnosis not present

## 2022-01-10 DIAGNOSIS — J101 Influenza due to other identified influenza virus with other respiratory manifestations: Secondary | ICD-10-CM | POA: Diagnosis not present

## 2022-01-10 DIAGNOSIS — N390 Urinary tract infection, site not specified: Secondary | ICD-10-CM | POA: Diagnosis not present

## 2022-02-01 DIAGNOSIS — R4689 Other symptoms and signs involving appearance and behavior: Secondary | ICD-10-CM | POA: Diagnosis not present

## 2022-02-05 DIAGNOSIS — N390 Urinary tract infection, site not specified: Secondary | ICD-10-CM | POA: Diagnosis not present

## 2022-02-21 DIAGNOSIS — I10 Essential (primary) hypertension: Secondary | ICD-10-CM | POA: Diagnosis not present

## 2022-02-21 DIAGNOSIS — E785 Hyperlipidemia, unspecified: Secondary | ICD-10-CM | POA: Diagnosis not present

## 2022-02-21 DIAGNOSIS — F0280 Dementia in other diseases classified elsewhere without behavioral disturbance: Secondary | ICD-10-CM | POA: Diagnosis not present

## 2022-02-21 DIAGNOSIS — J309 Allergic rhinitis, unspecified: Secondary | ICD-10-CM | POA: Diagnosis not present

## 2022-02-21 DIAGNOSIS — N189 Chronic kidney disease, unspecified: Secondary | ICD-10-CM | POA: Diagnosis not present

## 2022-02-22 DIAGNOSIS — E119 Type 2 diabetes mellitus without complications: Secondary | ICD-10-CM | POA: Diagnosis not present

## 2022-03-03 DIAGNOSIS — Z79899 Other long term (current) drug therapy: Secondary | ICD-10-CM | POA: Diagnosis not present

## 2022-03-12 DIAGNOSIS — R41841 Cognitive communication deficit: Secondary | ICD-10-CM | POA: Diagnosis not present

## 2022-03-27 DIAGNOSIS — H179 Unspecified corneal scar and opacity: Secondary | ICD-10-CM | POA: Diagnosis not present

## 2022-03-27 DIAGNOSIS — F0390 Unspecified dementia without behavioral disturbance: Secondary | ICD-10-CM | POA: Diagnosis not present

## 2022-03-27 DIAGNOSIS — Z0100 Encounter for examination of eyes and vision without abnormal findings: Secondary | ICD-10-CM | POA: Diagnosis not present

## 2022-03-27 DIAGNOSIS — H2513 Age-related nuclear cataract, bilateral: Secondary | ICD-10-CM | POA: Diagnosis not present

## 2022-04-03 DIAGNOSIS — B351 Tinea unguium: Secondary | ICD-10-CM | POA: Diagnosis not present

## 2022-04-03 DIAGNOSIS — I739 Peripheral vascular disease, unspecified: Secondary | ICD-10-CM | POA: Diagnosis not present

## 2022-04-03 DIAGNOSIS — M79674 Pain in right toe(s): Secondary | ICD-10-CM | POA: Diagnosis not present

## 2022-04-03 DIAGNOSIS — L603 Nail dystrophy: Secondary | ICD-10-CM | POA: Diagnosis not present

## 2022-04-05 DIAGNOSIS — R2242 Localized swelling, mass and lump, left lower limb: Secondary | ICD-10-CM | POA: Diagnosis not present

## 2022-04-20 DIAGNOSIS — E119 Type 2 diabetes mellitus without complications: Secondary | ICD-10-CM | POA: Diagnosis not present

## 2022-04-23 DIAGNOSIS — F0390 Unspecified dementia without behavioral disturbance: Secondary | ICD-10-CM | POA: Diagnosis not present

## 2022-04-23 DIAGNOSIS — K219 Gastro-esophageal reflux disease without esophagitis: Secondary | ICD-10-CM | POA: Diagnosis not present

## 2022-04-23 DIAGNOSIS — I10 Essential (primary) hypertension: Secondary | ICD-10-CM | POA: Diagnosis not present

## 2022-04-30 DIAGNOSIS — D519 Vitamin B12 deficiency anemia, unspecified: Secondary | ICD-10-CM | POA: Diagnosis not present

## 2022-04-30 DIAGNOSIS — E785 Hyperlipidemia, unspecified: Secondary | ICD-10-CM | POA: Diagnosis not present

## 2022-06-07 DIAGNOSIS — M1612 Unilateral primary osteoarthritis, left hip: Secondary | ICD-10-CM | POA: Diagnosis not present

## 2022-06-12 DIAGNOSIS — M25552 Pain in left hip: Secondary | ICD-10-CM | POA: Diagnosis not present

## 2022-06-27 DIAGNOSIS — J309 Allergic rhinitis, unspecified: Secondary | ICD-10-CM | POA: Diagnosis not present

## 2022-06-27 DIAGNOSIS — E785 Hyperlipidemia, unspecified: Secondary | ICD-10-CM | POA: Diagnosis not present

## 2022-06-27 DIAGNOSIS — I10 Essential (primary) hypertension: Secondary | ICD-10-CM | POA: Diagnosis not present

## 2022-06-27 DIAGNOSIS — K219 Gastro-esophageal reflux disease without esophagitis: Secondary | ICD-10-CM | POA: Diagnosis not present

## 2022-06-27 DIAGNOSIS — F0390 Unspecified dementia without behavioral disturbance: Secondary | ICD-10-CM | POA: Diagnosis not present

## 2022-07-23 DIAGNOSIS — I10 Essential (primary) hypertension: Secondary | ICD-10-CM | POA: Diagnosis not present

## 2022-07-23 DIAGNOSIS — Z7189 Other specified counseling: Secondary | ICD-10-CM | POA: Diagnosis not present

## 2022-08-27 DIAGNOSIS — F0390 Unspecified dementia without behavioral disturbance: Secondary | ICD-10-CM | POA: Diagnosis not present

## 2022-08-27 DIAGNOSIS — I10 Essential (primary) hypertension: Secondary | ICD-10-CM | POA: Diagnosis not present

## 2022-08-27 DIAGNOSIS — E785 Hyperlipidemia, unspecified: Secondary | ICD-10-CM | POA: Diagnosis not present

## 2022-08-28 DIAGNOSIS — K219 Gastro-esophageal reflux disease without esophagitis: Secondary | ICD-10-CM | POA: Diagnosis not present

## 2022-09-04 DIAGNOSIS — D519 Vitamin B12 deficiency anemia, unspecified: Secondary | ICD-10-CM | POA: Diagnosis not present

## 2022-09-04 DIAGNOSIS — N189 Chronic kidney disease, unspecified: Secondary | ICD-10-CM | POA: Diagnosis not present

## 2022-10-01 DIAGNOSIS — F4323 Adjustment disorder with mixed anxiety and depressed mood: Secondary | ICD-10-CM | POA: Diagnosis not present

## 2022-10-01 DIAGNOSIS — G308 Other Alzheimer's disease: Secondary | ICD-10-CM | POA: Diagnosis not present

## 2022-10-01 DIAGNOSIS — F02C0 Dementia in other diseases classified elsewhere, severe, without behavioral disturbance, psychotic disturbance, mood disturbance, and anxiety: Secondary | ICD-10-CM | POA: Diagnosis not present

## 2022-10-29 DIAGNOSIS — F02C0 Dementia in other diseases classified elsewhere, severe, without behavioral disturbance, psychotic disturbance, mood disturbance, and anxiety: Secondary | ICD-10-CM | POA: Diagnosis not present

## 2022-10-29 DIAGNOSIS — G308 Other Alzheimer's disease: Secondary | ICD-10-CM | POA: Diagnosis not present

## 2022-10-29 DIAGNOSIS — F4323 Adjustment disorder with mixed anxiety and depressed mood: Secondary | ICD-10-CM | POA: Diagnosis not present

## 2022-10-31 DIAGNOSIS — N189 Chronic kidney disease, unspecified: Secondary | ICD-10-CM | POA: Diagnosis not present

## 2022-10-31 DIAGNOSIS — M1990 Unspecified osteoarthritis, unspecified site: Secondary | ICD-10-CM | POA: Diagnosis not present

## 2022-10-31 DIAGNOSIS — G309 Alzheimer's disease, unspecified: Secondary | ICD-10-CM | POA: Diagnosis not present

## 2022-10-31 DIAGNOSIS — K219 Gastro-esophageal reflux disease without esophagitis: Secondary | ICD-10-CM | POA: Diagnosis not present

## 2022-10-31 DIAGNOSIS — F0390 Unspecified dementia without behavioral disturbance: Secondary | ICD-10-CM | POA: Diagnosis not present

## 2022-10-31 DIAGNOSIS — I10 Essential (primary) hypertension: Secondary | ICD-10-CM | POA: Diagnosis not present

## 2022-10-31 DIAGNOSIS — E785 Hyperlipidemia, unspecified: Secondary | ICD-10-CM | POA: Diagnosis not present

## 2022-11-14 DIAGNOSIS — L84 Corns and callosities: Secondary | ICD-10-CM | POA: Diagnosis not present

## 2022-11-14 DIAGNOSIS — I739 Peripheral vascular disease, unspecified: Secondary | ICD-10-CM | POA: Diagnosis not present

## 2022-11-14 DIAGNOSIS — L602 Onychogryphosis: Secondary | ICD-10-CM | POA: Diagnosis not present

## 2022-11-14 DIAGNOSIS — L603 Nail dystrophy: Secondary | ICD-10-CM | POA: Diagnosis not present

## 2022-11-14 DIAGNOSIS — L97512 Non-pressure chronic ulcer of other part of right foot with fat layer exposed: Secondary | ICD-10-CM | POA: Diagnosis not present

## 2022-11-19 DIAGNOSIS — I10 Essential (primary) hypertension: Secondary | ICD-10-CM | POA: Diagnosis not present

## 2022-12-03 DIAGNOSIS — I10 Essential (primary) hypertension: Secondary | ICD-10-CM | POA: Diagnosis not present

## 2022-12-06 DIAGNOSIS — N189 Chronic kidney disease, unspecified: Secondary | ICD-10-CM | POA: Diagnosis not present

## 2022-12-06 DIAGNOSIS — I10 Essential (primary) hypertension: Secondary | ICD-10-CM | POA: Diagnosis not present

## 2022-12-13 DIAGNOSIS — I10 Essential (primary) hypertension: Secondary | ICD-10-CM | POA: Diagnosis not present

## 2022-12-31 DIAGNOSIS — E785 Hyperlipidemia, unspecified: Secondary | ICD-10-CM | POA: Diagnosis not present

## 2022-12-31 DIAGNOSIS — M1990 Unspecified osteoarthritis, unspecified site: Secondary | ICD-10-CM | POA: Diagnosis not present

## 2022-12-31 DIAGNOSIS — I10 Essential (primary) hypertension: Secondary | ICD-10-CM | POA: Diagnosis not present

## 2022-12-31 DIAGNOSIS — F0390 Unspecified dementia without behavioral disturbance: Secondary | ICD-10-CM | POA: Diagnosis not present

## 2023-01-03 DIAGNOSIS — M1990 Unspecified osteoarthritis, unspecified site: Secondary | ICD-10-CM | POA: Diagnosis not present

## 2023-01-03 DIAGNOSIS — B353 Tinea pedis: Secondary | ICD-10-CM | POA: Diagnosis not present

## 2023-01-31 DIAGNOSIS — R609 Edema, unspecified: Secondary | ICD-10-CM | POA: Diagnosis not present

## 2023-02-06 DIAGNOSIS — Z96641 Presence of right artificial hip joint: Secondary | ICD-10-CM | POA: Diagnosis not present

## 2023-02-06 DIAGNOSIS — M25551 Pain in right hip: Secondary | ICD-10-CM | POA: Diagnosis not present

## 2023-02-26 DIAGNOSIS — M25561 Pain in right knee: Secondary | ICD-10-CM | POA: Diagnosis not present

## 2023-02-26 DIAGNOSIS — K219 Gastro-esophageal reflux disease without esophagitis: Secondary | ICD-10-CM | POA: Diagnosis not present

## 2023-02-26 DIAGNOSIS — M1711 Unilateral primary osteoarthritis, right knee: Secondary | ICD-10-CM | POA: Diagnosis not present

## 2023-02-26 DIAGNOSIS — M1990 Unspecified osteoarthritis, unspecified site: Secondary | ICD-10-CM | POA: Diagnosis not present

## 2023-02-27 DIAGNOSIS — K219 Gastro-esophageal reflux disease without esophagitis: Secondary | ICD-10-CM | POA: Diagnosis not present

## 2023-02-27 DIAGNOSIS — F0390 Unspecified dementia without behavioral disturbance: Secondary | ICD-10-CM | POA: Diagnosis not present

## 2023-02-27 DIAGNOSIS — D519 Vitamin B12 deficiency anemia, unspecified: Secondary | ICD-10-CM | POA: Diagnosis not present

## 2023-02-27 DIAGNOSIS — M1990 Unspecified osteoarthritis, unspecified site: Secondary | ICD-10-CM | POA: Diagnosis not present

## 2023-02-27 DIAGNOSIS — E785 Hyperlipidemia, unspecified: Secondary | ICD-10-CM | POA: Diagnosis not present

## 2023-02-27 DIAGNOSIS — I10 Essential (primary) hypertension: Secondary | ICD-10-CM | POA: Diagnosis not present

## 2023-03-02 DIAGNOSIS — R4182 Altered mental status, unspecified: Secondary | ICD-10-CM | POA: Diagnosis not present

## 2023-03-04 DIAGNOSIS — N390 Urinary tract infection, site not specified: Secondary | ICD-10-CM | POA: Diagnosis not present

## 2023-03-04 DIAGNOSIS — M25561 Pain in right knee: Secondary | ICD-10-CM | POA: Diagnosis not present

## 2023-03-04 DIAGNOSIS — R4689 Other symptoms and signs involving appearance and behavior: Secondary | ICD-10-CM | POA: Diagnosis not present

## 2023-03-04 DIAGNOSIS — M1990 Unspecified osteoarthritis, unspecified site: Secondary | ICD-10-CM | POA: Diagnosis not present

## 2023-03-04 DIAGNOSIS — K219 Gastro-esophageal reflux disease without esophagitis: Secondary | ICD-10-CM | POA: Diagnosis not present

## 2023-03-04 DIAGNOSIS — M25551 Pain in right hip: Secondary | ICD-10-CM | POA: Diagnosis not present

## 2023-03-05 DIAGNOSIS — E559 Vitamin D deficiency, unspecified: Secondary | ICD-10-CM | POA: Diagnosis not present

## 2023-03-05 DIAGNOSIS — E785 Hyperlipidemia, unspecified: Secondary | ICD-10-CM | POA: Diagnosis not present

## 2023-03-05 DIAGNOSIS — N189 Chronic kidney disease, unspecified: Secondary | ICD-10-CM | POA: Diagnosis not present

## 2023-03-05 DIAGNOSIS — I10 Essential (primary) hypertension: Secondary | ICD-10-CM | POA: Diagnosis not present

## 2023-03-05 DIAGNOSIS — D519 Vitamin B12 deficiency anemia, unspecified: Secondary | ICD-10-CM | POA: Diagnosis not present

## 2023-03-22 DIAGNOSIS — B353 Tinea pedis: Secondary | ICD-10-CM | POA: Diagnosis not present

## 2023-03-22 DIAGNOSIS — L602 Onychogryphosis: Secondary | ICD-10-CM | POA: Diagnosis not present

## 2023-03-22 DIAGNOSIS — I739 Peripheral vascular disease, unspecified: Secondary | ICD-10-CM | POA: Diagnosis not present

## 2023-03-22 DIAGNOSIS — L84 Corns and callosities: Secondary | ICD-10-CM | POA: Diagnosis not present

## 2023-04-12 DIAGNOSIS — E559 Vitamin D deficiency, unspecified: Secondary | ICD-10-CM | POA: Diagnosis not present

## 2023-04-15 DIAGNOSIS — E559 Vitamin D deficiency, unspecified: Secondary | ICD-10-CM | POA: Diagnosis not present

## 2023-04-18 DIAGNOSIS — F0390 Unspecified dementia without behavioral disturbance: Secondary | ICD-10-CM | POA: Diagnosis not present

## 2023-04-19 DIAGNOSIS — Z79899 Other long term (current) drug therapy: Secondary | ICD-10-CM | POA: Diagnosis not present

## 2023-04-19 DIAGNOSIS — F02C0 Dementia in other diseases classified elsewhere, severe, without behavioral disturbance, psychotic disturbance, mood disturbance, and anxiety: Secondary | ICD-10-CM | POA: Diagnosis not present

## 2023-04-19 DIAGNOSIS — F413 Other mixed anxiety disorders: Secondary | ICD-10-CM | POA: Diagnosis not present

## 2023-04-19 DIAGNOSIS — G308 Other Alzheimer's disease: Secondary | ICD-10-CM | POA: Diagnosis not present

## 2023-04-25 DIAGNOSIS — E785 Hyperlipidemia, unspecified: Secondary | ICD-10-CM | POA: Diagnosis not present

## 2023-04-25 DIAGNOSIS — D519 Vitamin B12 deficiency anemia, unspecified: Secondary | ICD-10-CM | POA: Diagnosis not present

## 2023-04-25 DIAGNOSIS — J309 Allergic rhinitis, unspecified: Secondary | ICD-10-CM | POA: Diagnosis not present

## 2023-04-25 DIAGNOSIS — F0390 Unspecified dementia without behavioral disturbance: Secondary | ICD-10-CM | POA: Diagnosis not present

## 2023-05-02 DIAGNOSIS — R609 Edema, unspecified: Secondary | ICD-10-CM | POA: Diagnosis not present

## 2023-05-27 DIAGNOSIS — F413 Other mixed anxiety disorders: Secondary | ICD-10-CM | POA: Diagnosis not present

## 2023-05-27 DIAGNOSIS — G308 Other Alzheimer's disease: Secondary | ICD-10-CM | POA: Diagnosis not present

## 2023-05-27 DIAGNOSIS — F02C0 Dementia in other diseases classified elsewhere, severe, without behavioral disturbance, psychotic disturbance, mood disturbance, and anxiety: Secondary | ICD-10-CM | POA: Diagnosis not present

## 2023-05-29 DIAGNOSIS — E559 Vitamin D deficiency, unspecified: Secondary | ICD-10-CM | POA: Diagnosis not present

## 2023-06-19 DIAGNOSIS — N189 Chronic kidney disease, unspecified: Secondary | ICD-10-CM | POA: Diagnosis not present

## 2023-06-19 DIAGNOSIS — G301 Alzheimer's disease with late onset: Secondary | ICD-10-CM | POA: Diagnosis not present

## 2023-06-19 DIAGNOSIS — G309 Alzheimer's disease, unspecified: Secondary | ICD-10-CM | POA: Diagnosis not present

## 2023-06-19 DIAGNOSIS — F0390 Unspecified dementia without behavioral disturbance: Secondary | ICD-10-CM | POA: Diagnosis not present

## 2023-06-19 DIAGNOSIS — K219 Gastro-esophageal reflux disease without esophagitis: Secondary | ICD-10-CM | POA: Diagnosis not present

## 2023-06-19 DIAGNOSIS — E785 Hyperlipidemia, unspecified: Secondary | ICD-10-CM | POA: Diagnosis not present

## 2023-06-19 DIAGNOSIS — I10 Essential (primary) hypertension: Secondary | ICD-10-CM | POA: Diagnosis not present

## 2023-06-19 DIAGNOSIS — E559 Vitamin D deficiency, unspecified: Secondary | ICD-10-CM | POA: Diagnosis not present

## 2023-06-19 DIAGNOSIS — E7849 Other hyperlipidemia: Secondary | ICD-10-CM | POA: Diagnosis not present

## 2023-06-19 DIAGNOSIS — F0284 Dementia in other diseases classified elsewhere, unspecified severity, with anxiety: Secondary | ICD-10-CM | POA: Diagnosis not present

## 2023-06-19 DIAGNOSIS — M6281 Muscle weakness (generalized): Secondary | ICD-10-CM | POA: Diagnosis not present

## 2023-06-19 DIAGNOSIS — I129 Hypertensive chronic kidney disease with stage 1 through stage 4 chronic kidney disease, or unspecified chronic kidney disease: Secondary | ICD-10-CM | POA: Diagnosis not present

## 2023-06-19 DIAGNOSIS — F419 Anxiety disorder, unspecified: Secondary | ICD-10-CM | POA: Diagnosis not present

## 2023-07-23 DIAGNOSIS — L602 Onychogryphosis: Secondary | ICD-10-CM | POA: Diagnosis not present

## 2023-07-23 DIAGNOSIS — I739 Peripheral vascular disease, unspecified: Secondary | ICD-10-CM | POA: Diagnosis not present

## 2023-07-24 DIAGNOSIS — I10 Essential (primary) hypertension: Secondary | ICD-10-CM | POA: Diagnosis not present

## 2023-07-24 DIAGNOSIS — R600 Localized edema: Secondary | ICD-10-CM | POA: Diagnosis not present

## 2023-07-24 DIAGNOSIS — I509 Heart failure, unspecified: Secondary | ICD-10-CM | POA: Diagnosis not present

## 2023-08-05 DIAGNOSIS — F02C0 Dementia in other diseases classified elsewhere, severe, without behavioral disturbance, psychotic disturbance, mood disturbance, and anxiety: Secondary | ICD-10-CM | POA: Diagnosis not present

## 2023-08-05 DIAGNOSIS — G308 Other Alzheimer's disease: Secondary | ICD-10-CM | POA: Diagnosis not present

## 2023-08-05 DIAGNOSIS — F413 Other mixed anxiety disorders: Secondary | ICD-10-CM | POA: Diagnosis not present

## 2023-08-14 DIAGNOSIS — F0390 Unspecified dementia without behavioral disturbance: Secondary | ICD-10-CM | POA: Diagnosis not present

## 2023-08-14 DIAGNOSIS — F0280 Dementia in other diseases classified elsewhere without behavioral disturbance: Secondary | ICD-10-CM | POA: Diagnosis not present

## 2023-08-14 DIAGNOSIS — D649 Anemia, unspecified: Secondary | ICD-10-CM | POA: Diagnosis not present

## 2023-08-14 DIAGNOSIS — N189 Chronic kidney disease, unspecified: Secondary | ICD-10-CM | POA: Diagnosis not present

## 2023-08-14 DIAGNOSIS — I739 Peripheral vascular disease, unspecified: Secondary | ICD-10-CM | POA: Diagnosis not present

## 2023-08-14 DIAGNOSIS — I10 Essential (primary) hypertension: Secondary | ICD-10-CM | POA: Diagnosis not present

## 2023-08-14 DIAGNOSIS — G309 Alzheimer's disease, unspecified: Secondary | ICD-10-CM | POA: Diagnosis not present

## 2023-08-14 DIAGNOSIS — K219 Gastro-esophageal reflux disease without esophagitis: Secondary | ICD-10-CM | POA: Diagnosis not present

## 2023-08-14 DIAGNOSIS — I129 Hypertensive chronic kidney disease with stage 1 through stage 4 chronic kidney disease, or unspecified chronic kidney disease: Secondary | ICD-10-CM | POA: Diagnosis not present

## 2023-08-14 DIAGNOSIS — E785 Hyperlipidemia, unspecified: Secondary | ICD-10-CM | POA: Diagnosis not present

## 2023-09-16 DIAGNOSIS — G308 Other Alzheimer's disease: Secondary | ICD-10-CM | POA: Diagnosis not present

## 2023-09-16 DIAGNOSIS — F413 Other mixed anxiety disorders: Secondary | ICD-10-CM | POA: Diagnosis not present

## 2023-09-16 DIAGNOSIS — F02C0 Dementia in other diseases classified elsewhere, severe, without behavioral disturbance, psychotic disturbance, mood disturbance, and anxiety: Secondary | ICD-10-CM | POA: Diagnosis not present

## 2023-10-10 DIAGNOSIS — K219 Gastro-esophageal reflux disease without esophagitis: Secondary | ICD-10-CM | POA: Diagnosis not present

## 2023-10-10 DIAGNOSIS — I10 Essential (primary) hypertension: Secondary | ICD-10-CM | POA: Diagnosis not present

## 2023-10-10 DIAGNOSIS — M1990 Unspecified osteoarthritis, unspecified site: Secondary | ICD-10-CM | POA: Diagnosis not present

## 2023-10-10 DIAGNOSIS — D519 Vitamin B12 deficiency anemia, unspecified: Secondary | ICD-10-CM | POA: Diagnosis not present

## 2023-10-10 DIAGNOSIS — E785 Hyperlipidemia, unspecified: Secondary | ICD-10-CM | POA: Diagnosis not present

## 2023-10-10 DIAGNOSIS — E559 Vitamin D deficiency, unspecified: Secondary | ICD-10-CM | POA: Diagnosis not present

## 2023-10-10 DIAGNOSIS — F0390 Unspecified dementia without behavioral disturbance: Secondary | ICD-10-CM | POA: Diagnosis not present

## 2023-10-16 DIAGNOSIS — N189 Chronic kidney disease, unspecified: Secondary | ICD-10-CM | POA: Diagnosis not present

## 2023-10-16 DIAGNOSIS — I10 Essential (primary) hypertension: Secondary | ICD-10-CM | POA: Diagnosis not present

## 2023-10-16 DIAGNOSIS — E559 Vitamin D deficiency, unspecified: Secondary | ICD-10-CM | POA: Diagnosis not present

## 2023-10-16 DIAGNOSIS — C61 Malignant neoplasm of prostate: Secondary | ICD-10-CM | POA: Diagnosis not present

## 2023-10-16 DIAGNOSIS — G308 Other Alzheimer's disease: Secondary | ICD-10-CM | POA: Diagnosis not present

## 2023-10-16 DIAGNOSIS — I6529 Occlusion and stenosis of unspecified carotid artery: Secondary | ICD-10-CM | POA: Diagnosis not present

## 2023-10-16 DIAGNOSIS — E785 Hyperlipidemia, unspecified: Secondary | ICD-10-CM | POA: Diagnosis not present

## 2023-10-16 DIAGNOSIS — K219 Gastro-esophageal reflux disease without esophagitis: Secondary | ICD-10-CM | POA: Diagnosis not present

## 2023-10-17 DIAGNOSIS — E785 Hyperlipidemia, unspecified: Secondary | ICD-10-CM | POA: Diagnosis not present

## 2023-10-17 DIAGNOSIS — D649 Anemia, unspecified: Secondary | ICD-10-CM | POA: Diagnosis not present

## 2023-10-17 DIAGNOSIS — E559 Vitamin D deficiency, unspecified: Secondary | ICD-10-CM | POA: Diagnosis not present

## 2023-10-31 DIAGNOSIS — R69 Illness, unspecified: Secondary | ICD-10-CM | POA: Diagnosis not present

## 2023-11-20 DIAGNOSIS — G308 Other Alzheimer's disease: Secondary | ICD-10-CM | POA: Diagnosis not present

## 2023-11-20 DIAGNOSIS — D519 Vitamin B12 deficiency anemia, unspecified: Secondary | ICD-10-CM | POA: Diagnosis not present

## 2023-11-20 DIAGNOSIS — N189 Chronic kidney disease, unspecified: Secondary | ICD-10-CM | POA: Diagnosis not present

## 2023-11-20 DIAGNOSIS — C61 Malignant neoplasm of prostate: Secondary | ICD-10-CM | POA: Diagnosis not present

## 2023-11-21 DIAGNOSIS — D649 Anemia, unspecified: Secondary | ICD-10-CM | POA: Diagnosis not present

## 2023-12-02 DIAGNOSIS — G308 Other Alzheimer's disease: Secondary | ICD-10-CM | POA: Diagnosis not present

## 2023-12-02 DIAGNOSIS — F02C0 Dementia in other diseases classified elsewhere, severe, without behavioral disturbance, psychotic disturbance, mood disturbance, and anxiety: Secondary | ICD-10-CM | POA: Diagnosis not present

## 2023-12-02 DIAGNOSIS — F413 Other mixed anxiety disorders: Secondary | ICD-10-CM | POA: Diagnosis not present

## 2023-12-03 DIAGNOSIS — I739 Peripheral vascular disease, unspecified: Secondary | ICD-10-CM | POA: Diagnosis not present

## 2023-12-03 DIAGNOSIS — L602 Onychogryphosis: Secondary | ICD-10-CM | POA: Diagnosis not present

## 2023-12-05 DIAGNOSIS — Z7189 Other specified counseling: Secondary | ICD-10-CM | POA: Diagnosis not present

## 2023-12-11 DIAGNOSIS — F0390 Unspecified dementia without behavioral disturbance: Secondary | ICD-10-CM | POA: Diagnosis not present

## 2023-12-11 DIAGNOSIS — I10 Essential (primary) hypertension: Secondary | ICD-10-CM | POA: Diagnosis not present

## 2023-12-11 DIAGNOSIS — K219 Gastro-esophageal reflux disease without esophagitis: Secondary | ICD-10-CM | POA: Diagnosis not present

## 2023-12-11 DIAGNOSIS — G309 Alzheimer's disease, unspecified: Secondary | ICD-10-CM | POA: Diagnosis not present

## 2023-12-11 DIAGNOSIS — E559 Vitamin D deficiency, unspecified: Secondary | ICD-10-CM | POA: Diagnosis not present

## 2023-12-11 DIAGNOSIS — D649 Anemia, unspecified: Secondary | ICD-10-CM | POA: Diagnosis not present

## 2023-12-11 DIAGNOSIS — I739 Peripheral vascular disease, unspecified: Secondary | ICD-10-CM | POA: Diagnosis not present

## 2023-12-11 DIAGNOSIS — N189 Chronic kidney disease, unspecified: Secondary | ICD-10-CM | POA: Diagnosis not present

## 2023-12-12 DIAGNOSIS — L98499 Non-pressure chronic ulcer of skin of other sites with unspecified severity: Secondary | ICD-10-CM | POA: Diagnosis not present

## 2023-12-25 DIAGNOSIS — K219 Gastro-esophageal reflux disease without esophagitis: Secondary | ICD-10-CM | POA: Diagnosis not present

## 2023-12-25 DIAGNOSIS — I10 Essential (primary) hypertension: Secondary | ICD-10-CM | POA: Diagnosis not present

## 2023-12-25 DIAGNOSIS — G308 Other Alzheimer's disease: Secondary | ICD-10-CM | POA: Diagnosis not present

## 2023-12-25 DIAGNOSIS — C61 Malignant neoplasm of prostate: Secondary | ICD-10-CM | POA: Diagnosis not present
